# Patient Record
Sex: Male | Born: 1940 | Race: Black or African American | Hispanic: No | Marital: Single | Smoking: Current some day smoker
Health system: Southern US, Community
[De-identification: ages and names within clinical notes are randomized; demographics above are authoritative.]

## PROBLEM LIST (undated history)

## (undated) DIAGNOSIS — I10 Essential (primary) hypertension: Secondary | ICD-10-CM

## (undated) DIAGNOSIS — N400 Enlarged prostate without lower urinary tract symptoms: Secondary | ICD-10-CM

## (undated) DIAGNOSIS — E7800 Pure hypercholesterolemia, unspecified: Secondary | ICD-10-CM

## (undated) DIAGNOSIS — F4310 Post-traumatic stress disorder, unspecified: Secondary | ICD-10-CM

## (undated) DIAGNOSIS — D649 Anemia, unspecified: Secondary | ICD-10-CM

## (undated) DIAGNOSIS — H9319 Tinnitus, unspecified ear: Secondary | ICD-10-CM

## (undated) DIAGNOSIS — K219 Gastro-esophageal reflux disease without esophagitis: Secondary | ICD-10-CM

## (undated) DIAGNOSIS — G4733 Obstructive sleep apnea (adult) (pediatric): Secondary | ICD-10-CM

## (undated) HISTORY — PX: CYST EXCISION: SHX5701

## (undated) HISTORY — PX: COLONOSCOPY: SHX174

---

## 2010-03-08 ENCOUNTER — Ambulatory Visit: Payer: Self-pay | Admitting: Family Medicine

## 2010-03-27 ENCOUNTER — Ambulatory Visit: Payer: Self-pay | Admitting: Family Medicine

## 2010-04-20 ENCOUNTER — Ambulatory Visit: Payer: Self-pay | Admitting: Family Medicine

## 2012-11-19 ENCOUNTER — Observation Stay: Payer: Self-pay | Admitting: Specialist

## 2012-11-19 LAB — BASIC METABOLIC PANEL
Anion Gap: 5 — ABNORMAL LOW (ref 7–16)
BUN: 15 mg/dL (ref 7–18)
Chloride: 106 mmol/L (ref 98–107)
Co2: 28 mmol/L (ref 21–32)
EGFR (Non-African Amer.): 59 — ABNORMAL LOW
Osmolality: 281 (ref 275–301)
Sodium: 139 mmol/L (ref 136–145)

## 2012-11-19 LAB — CK TOTAL AND CKMB (NOT AT ARMC): CK-MB: 0.9 ng/mL (ref 0.5–3.6)

## 2012-11-19 LAB — LIPASE, BLOOD: Lipase: 168 U/L (ref 73–393)

## 2012-11-19 LAB — CBC
HGB: 13.5 g/dL (ref 13.0–18.0)
Platelet: 306 10*3/uL (ref 150–440)
RDW: 14.8 % — ABNORMAL HIGH (ref 11.5–14.5)
WBC: 12.1 10*3/uL — ABNORMAL HIGH (ref 3.8–10.6)

## 2012-11-19 LAB — TROPONIN I: Troponin-I: <0.02 ng/mL

## 2012-11-20 LAB — TROPONIN I: Troponin-I: <0.02 ng/mL

## 2012-11-21 LAB — CBC WITH DIFFERENTIAL/PLATELET
Basophil #: 0.1 10*3/uL (ref 0.0–0.1)
Basophil %: 0.7 %
HCT: 36.4 % — ABNORMAL LOW (ref 40.0–52.0)
Lymphocyte #: 3.3 10*3/uL (ref 1.0–3.6)
Lymphocyte %: 29.9 %
MCH: 30.8 pg (ref 26.0–34.0)
MCV: 90 fL (ref 80–100)
Monocyte %: 7.6 %
Neutrophil #: 6.6 10*3/uL — ABNORMAL HIGH (ref 1.4–6.5)
Neutrophil %: 59.2 %
RBC: 4.06 10*6/uL — ABNORMAL LOW (ref 4.40–5.90)
RDW: 14.5 % (ref 11.5–14.5)

## 2013-06-26 ENCOUNTER — Emergency Department: Payer: Self-pay | Admitting: Emergency Medicine

## 2013-06-26 LAB — COMPREHENSIVE METABOLIC PANEL
Albumin: 4.2 g/dL (ref 3.4–5.0)
Alkaline Phosphatase: 93 U/L
BUN: 15 mg/dL (ref 7–18)
Calcium, Total: 9 mg/dL (ref 8.5–10.1)
Co2: 28 mmol/L (ref 21–32)
Creatinine: 1.24 mg/dL (ref 0.60–1.30)
EGFR (African American): >60
Glucose: 143 mg/dL — ABNORMAL HIGH (ref 65–99)
Osmolality: 281 (ref 275–301)
Potassium: 3.3 mmol/L — ABNORMAL LOW (ref 3.5–5.1)
SGOT(AST): 14 U/L — ABNORMAL LOW (ref 15–37)

## 2013-06-26 LAB — CBC
HGB: 13.4 g/dL (ref 13.0–18.0)
MCH: 30.3 pg (ref 26.0–34.0)
MCHC: 33.2 g/dL (ref 32.0–36.0)
MCV: 91 fL (ref 80–100)
Platelet: 269 10*3/uL (ref 150–440)
RBC: 4.42 10*6/uL (ref 4.40–5.90)
WBC: 11.8 10*3/uL — ABNORMAL HIGH (ref 3.8–10.6)

## 2014-11-10 NOTE — Consult Note (Signed)
PATIENT NAME:  Jose Becker, VALLEY MR#:  [id number] DATE OF BIRTH:  [birth year]  DATE OF CONSULTATION:  11/20/2012  REFERRING PHYSICIAN:  Sainani CONSULTING PHYSICIAN:  Isaias Cowman, MD  PRIMARY CARE PHYSICIAN:  Linthavong  CHIEF COMPLAINT: Chest pain.   REASON FOR CONSULTATION:  Requested for evaluation of chest pain.   HISTORY OF PRESENT ILLNESS: The patient is a 74 year old gentleman without prior cardiac history. He has been in his usual state of health until 2 days prior to admission when he experienced right-sided chest discomfort. The patient thought this might be either musculoskeletal or GI in nature and took Gas-X without relief.  On the morning of admission, the patient experienced chest discomfort which woke him up.  The patient felt short of breath and diaphoretic and presented to [facility] Emergency Room. EKG was nondiagnostic. The patient has ruled out for myocardial infarction by CPK, isoenzymes and troponin.   PAST MEDICAL HISTORY: 1.  Hypertension.  2.  Hyperlipidemia.  3.  Borderline diabetes.   MEDICATIONS ON ADMISSION:  Lisinopril 40 mg daily, Nifedical 60 mg daily, alfuzosin 10 mg daily, aspirin 81 mg daily, Flomax 0.4 mg daily, Flonase nasal spray 2 sprays each nostril daily, meclizine 12.5 mg t.i.d., Voltaren Gel t.i.d., Tylenol Extra Strength 2 tabs q. 8 p.r.n.   SOCIAL HISTORY: The patient is married, lives with his wife. He has remote tobacco abuse history.   FAMILY HISTORY: No immediate family history of coronary disease or myocardial infarction.  REVIEW OF SYSTEMS:  CONSTITUTIONAL: No fever or chills.   EYES:  No blurry vision.  EARS:  No hearing loss.  RESPIRATORY:  No shortness of breath.  CARDIOVASCULAR:  Chest pain as described above.  GASTROINTESTINAL:  No nausea, vomiting, diarrhea or constipation.  GENITOURINARY:  No dysuria or hematuria.  ENDOCRINE:  No polyuria or polydipsia.  MUSCULOSKELETAL:  No arthralgias or myalgias.  NEUROLOGICAL:  No  focal muscle weakness or numbness.  PSYCHOLOGICAL:  No depression or anxiety.   PHYSICAL EXAMINATION: VITAL SIGNS: Blood pressure 161/82, pulse 76, respirations 20, temperature 98.1, pulse ox 95%.  HEENT:  Pupils equal, reactive to light and accommodation.  NECK:  Supple without thyromegaly.  LUNGS:  Clear.  CARDIOVASCULAR: Normal JVP. Normal PMI. Regular rate and rhythm. Normal S1, S2. No appreciable gallop, murmur or rub.  ABDOMEN:  Soft and nontender. Pulses were intact bilaterally.  MUSCULOSKELETAL:  Normal muscle tone.  NEUROLOGIC:  The patient is alert and oriented x 3. Motor and sensory both grossly intact.   IMPRESSION: A 74 year old gentleman with multiple cardiovascular risk factors with 2-day history of chest pain with typical and atypical features, has ruled out for myocardial infarction by CPK, isoenzymes and troponin.   RECOMMENDATIONS: 1.  Agree with overall current therapy.  2. Agree with proceeding with Lexiscan sestamibi study to rule out myocardial ischemia.  Further recommendations pending Lexiscan sestamibi results.    ____________________________ Isaias Cowman, MD ap:ce D: 11/20/2012 09:34:42 ET T: 11/20/2012 11:25:12 ET JOB#: [id number]  cc: Isaias Cowman, MD, <Dictator> Isaias Cowman MD ELECTRONICALLY SIGNED 11/29/2012 13:46

## 2014-11-10 NOTE — Discharge Summary (Signed)
PATIENT NAME:  Jose Becker, Jose Becker MR#:  [id number] DATE OF BIRTH:  03/22/1941  DATE OF ADMISSION:  11/19/2012 DATE OF DISCHARGE:  11/21/2012  DISCHARGE DIAGNOSES: 1.  Noncardiac chest pain.  2.  Hypertension.  3.  Diet-controlled diabetes.  4.  Benign prostatic hypertrophy.  5.  Hyperlipidemia.   DISCHARGE MEDICATIONS: 1.  Alfuzosin 10 mg extended-release 1 tab p.o. daily.  2.  Simvastatin 40 mg p.o. at bedtime.  3.  Aspirin 81 mg p.o. daily.  4.  Tylenol 500 mg 2 tabs p.o. q.8 hours scheduled.  5.  Voltaren topical 1% topical gel applied to affected area 3 times a day as needed.  6.  Lisinopril 40 mg p.o. daily.  7.  Fluticasone nasal 50 mcg 2 sprays each nostril daily.  8.  Meclizine 12.5 mg t.i.d. p.r.n. for dizziness.  9.  Flomax 0.4 mg p.o. daily.  10.  Nifedipine 90 mg extended-release 1 tab p.o. daily, this was increased from 60 mg.   CONSULTS: Cardiology per Dr. Saralyn Pilar.   PROCEDURES: Had a nuclear stress test that was negative.   LABS PRIOR TO DISCHARGE: Cardiac enzymes negative x 3. White blood cell count 12.1, hemoglobin 13.5, platelets of 306. Chest x-ray did show mild interstitial edema. Repeat white blood cell count pending at this time.   BRIEF HOSPITAL COURSE:  1.  Noncardiac chest pain. The patient initially came in complaining of right-sided chest pain that radiated across the chest. EKG was negative. Cardiac enzymes were negative x 3. Cardiology was consulted. A nuclear stress test was performed which was also negative for any type of ischemia. He was cleared for discharge. This is most likely musculoskeletal versus GI-related. Follow up as an outpatient. We will continue his home regimen to control his risk factors.  2.  Hypertension. His blood pressure was acutely elevated. His nifedipine was increased from 60 to 90 during his hospital stay. Will need to monitor this as an outpatient.  3.  Other chronic medical issues remained stable. No changes to those  regimens.   DISPOSITION: He is in stable condition to be discharged to home.   FOLLOWUP: Follow up with Dr. Netty Starring in 10 days.   ____________________________ Dion Body, MD kl:cs D: 11/21/2012 08:41:41 ET T: 11/21/2012 14:55:04 ET JOB#: [id number]  cc: Dion Body, MD, <Dictator> Dion Body MD ELECTRONICALLY SIGNED 11/24/2012 16:50

## 2014-11-10 NOTE — H&P (Signed)
PATIENT NAME:  ZAYYAN, MULLEN MR#:  [id number] DATE OF BIRTH:  1941-01-12  DATE OF ADMISSION:  11/19/2012.  PRIMARY CARE PHYSICIAN:  Dr. Netty Starring.   CHIEF COMPLAINT:  Chest pain.   HISTORY OF PRESENT ILLNESS:  This is a 74 year old male who presents to the Emergency Room complaining of chest pain ongoing for the past 2 days. The patient first describes his pain as sharp in nature, located in the right side of his chest, ongoing for the past week. The patient thought it was probably related to some gas or musculoskeletal in nature. He tried taking some Gas-X, although it did not improve his symptoms. This morning when he woke up, his pain had now migrated to the center of his chest and it felt heavy in nature. He also became a bit diaphoretic and short of breath with it. He was concerned and therefore came to the ER for further evaluation. The patient still continues to have some mild chest heaviness. He denies any upper respiratory symptoms. He does have a history of allergic rhinitis but no present acute cough or congestion. He denies any dizziness, light lightheadedness. He denies any nausea, vomiting, abdominal pain. No other associated symptoms presently. Given his history of borderline diabetes, hypertension, hyperlipidemia and high risk for having some coronary artery disease, the Hospitalist Services were contacted for further treatment and evaluation.   REVIEW OF SYSTEMS:  CONSTITUTIONAL:  No documented fever. No weight gain or weight loss.  EYES:  No blurred or double vision.  ENT:  No tinnitus. No postnasal drip. No redness of the oropharynx.  RESPIRATORY:  No cough, no wheeze, no hemoptysis, no dyspnea.  CARDIOVASCULAR:  Positive chest pain. No orthopnea, no palpitations, no syncope.  GASTROINTESTINAL:  No nausea. No vomiting, no diarrhea, no abdominal pain, no melena or hematochezia.  GENITOURINARY:  No dysuria or hematuria.  ENDOCRINE:  No polyuria or nocturia. No heat or cold  intolerance.  HEMATOLOGIC:  No anemia, bruising, bleeding.  INTEGUMENTARY:  No rashes. No lesions.  MUSCULOSKELETAL:  No arthritis. No swelling. No gout.  NEUROLOGIC:  No numbness, no tingling, no ataxia. No seizure-type activity.  PSYCHIATRIC:  No anxiety. No insomnia. No ADD.   PAST MEDICAL HISTORY:  Consistent with hypertension, hyperlipidemia, a history of osteoarthritis, borderline diabetes, obesity, a history of BPH.   ALLERGIES:  No known drug allergies.   SOCIAL HISTORY:  Used to be a smoker. Quit 30+ years ago. Occasional alcohol use. No illicit drug abuse. Lives at home with his wife.   FAMILY HISTORY:  His mother had diabetes> she died from some respiratory illness. Father died from complications of colon cancer.   CURRENT MEDICATIONS are as follows:  Alfuzosin which is 10 mg daily, aspirin 81 mg daily, Flomax 0.4 mg daily, Flonase 2 sprays to each nostril daily, lisinopril 40 mg daily, meclizine 12.5 mg t.i.d. as needed, Nifedical 60 mg daily, simvastatin 40 mg daily, Tylenol Extra Strength 500 mg 2 tabs q.8 hours as needed, on Voltaren gel t.i.d. as needed.   PHYSICAL EXAMINATION ON ADMISSION IS AS FOLLOWS:  VITAL SIGNS:  Temperature 98.5, pulse 70, respirations 16, blood pressure 135/79, sats 96% on room air.  GENERAL:  He is a pleasant appearing male in no apparent distress.  HEENT:  Atraumatic, normocephalic. Extraocular muscles are intact. Pupils equal, reactive to light. Sclerae anicteric. No conjunctival injection. No pharyngeal erythema.  NECK:  Supple. There is no jugular venous distention, no bruits, no lymphadenopathy, no thyromegaly.  HEART:  Regular rate  and rhythm. No murmurs or rubs. No clicks.  LUNGS:  Clear to auscultation bilaterally. No rales or rhonchi. No wheezes.  ABDOMEN:  Soft, flat, nontender, nondistended. Has good bowel sounds. No hepatosplenomegaly appreciated.  EXTREMITIES:  No evidence of any cyanosis, clubbing, or peripheral edema. Has +2 pedal and  radial pulses bilaterally.  NEUROLOGICAL:  The patient is alert, awake, and oriented x 3 with no focal motor or sensory deficits appreciated bilaterally.  SKIN:  Moist and warm with no rashes appreciated.  LYMPHATIC:  There is no cervical or axillary lymphadenopathy.   LABORATORY, DIAGNOSTIC, AND RADIOLOGICAL DATA:  Serum glucose 151, BUN 15, creatinine 1.2, sodium 139, potassium 3.5, chloride 106, bicarb 28. The patient's troponin is less than 0.02. White cell count 12.1, hemoglobin 13.5, hematocrit 39.9, platelet count 306. The patient did have a chest x-ray done which showed no evidence of any acute cardiopulmonary disease. The patient also had an EKG done, which shows normal sinus rhythm with normal axis and no evidence of any acute ST or T wave changes.   ASSESSMENT AND PLAN:  This is a 74 year old male with a history of hypertension, hyperlipidemia, a history of osteoarthritis, borderline diabetes, obesity, a history of benign prostatic hypertrophy who presents to the hospital with chest pain.   1.  Chest pain:  The patient does have some typical symptoms for angina, also has significant risk factors given obesity, borderline diabetes and hyperlipidemia. Therefore, I will observe him overnight on telemetry, follow with serial cardiac markers, the first set is negative. I will get a Cardiology consult. I discussed the case with Dr. Saralyn Pilar. We will get a stress test in the morning. Continue aspirin, statin, nitroglycerin, morphine and oxygen for now.  2.  Hypertension:  The patient presently hemodynamically stable. Continue Nifedical, continue lisinopril. 3.  Hyperlipidemia:  Continue simvastatin.  4.  Benign prostatic hypertrophy:  Continue Flomax 0.05. 5.  Allergic rhinitis:  Continue Flonase.   CODE STATUS:  The patient is a FULL CODE.   The patient will be transferred over to Dr. Raylene Miyamoto service.   TIME SPENT:  50 minutes.   ____________________________ Belia Heman. Verdell Carmine,  MD [id number] D: 11/19/2012 10:44:34 ET T: 11/19/2012 11:17:35 ET JOB#: [id number]  cc: Belia Heman. Verdell Carmine, MD, <Dictator> Henreitta Leber MD ELECTRONICALLY SIGNED 11/23/2012 12:52

## 2018-03-28 ENCOUNTER — Emergency Department: Payer: Non-veteran care

## 2018-03-28 ENCOUNTER — Encounter: Payer: Self-pay | Admitting: Emergency Medicine

## 2018-03-28 ENCOUNTER — Other Ambulatory Visit: Payer: Self-pay

## 2018-03-28 ENCOUNTER — Emergency Department
Admission: EM | Admit: 2018-03-28 | Discharge: 2018-03-28 | Disposition: A | Discharge status: Home / Self Care | Payer: Non-veteran care | Attending: Emergency Medicine | Admitting: Emergency Medicine

## 2018-03-28 DIAGNOSIS — R42 Dizziness and giddiness: Secondary | ICD-10-CM | POA: Diagnosis not present

## 2018-03-28 DIAGNOSIS — I10 Essential (primary) hypertension: Secondary | ICD-10-CM | POA: Diagnosis not present

## 2018-03-28 DIAGNOSIS — F17210 Nicotine dependence, cigarettes, uncomplicated: Secondary | ICD-10-CM | POA: Insufficient documentation

## 2018-03-28 DIAGNOSIS — Z79899 Other long term (current) drug therapy: Secondary | ICD-10-CM | POA: Insufficient documentation

## 2018-03-28 DIAGNOSIS — M79602 Pain in left arm: Secondary | ICD-10-CM | POA: Diagnosis present

## 2018-03-28 DIAGNOSIS — M542 Cervicalgia: Secondary | ICD-10-CM | POA: Diagnosis not present

## 2018-03-28 DIAGNOSIS — R079 Chest pain, unspecified: Secondary | ICD-10-CM

## 2018-03-28 HISTORY — DX: Anemia, unspecified: D64.9

## 2018-03-28 HISTORY — DX: Post-traumatic stress disorder, unspecified: F43.10

## 2018-03-28 HISTORY — DX: Pure hypercholesterolemia, unspecified: E78.00

## 2018-03-28 HISTORY — DX: Gilbert syndrome: E80.4

## 2018-03-28 HISTORY — DX: Gastro-esophageal reflux disease without esophagitis: K21.9

## 2018-03-28 HISTORY — DX: Benign prostatic hyperplasia without lower urinary tract symptoms: N40.0

## 2018-03-28 HISTORY — DX: Tinnitus, unspecified ear: H93.19

## 2018-03-28 HISTORY — DX: Essential (primary) hypertension: I10

## 2018-03-28 HISTORY — DX: Obstructive sleep apnea (adult) (pediatric): G47.33

## 2018-03-28 LAB — BASIC METABOLIC PANEL
ANION GAP: 8 (ref 5–15)
BUN: 17 mg/dL (ref 8–23)
CALCIUM: 9.4 mg/dL (ref 8.9–10.3)
CO2: 27 mmol/L (ref 22–32)
Chloride: 105 mmol/L (ref 98–111)
Creatinine, Ser: 1.25 mg/dL — ABNORMAL HIGH (ref 0.61–1.24)
GFR calc Af Amer: >60 mL/min (ref >60)
GFR, EST NON AFRICAN AMERICAN: 53 mL/min — AB (ref >60)
GLUCOSE: 120 mg/dL — AB (ref 70–99)
Potassium: 3.4 mmol/L — ABNORMAL LOW (ref 3.5–5.1)
Sodium: 140 mmol/L (ref 135–145)

## 2018-03-28 LAB — CBC
HCT: 38.7 % — ABNORMAL LOW (ref 40.0–52.0)
HEMOGLOBIN: 13.3 g/dL (ref 13.0–18.0)
MCH: 32.3 pg (ref 26.0–34.0)
MCHC: 34.5 g/dL (ref 32.0–36.0)
MCV: 93.7 fL (ref 80.0–100.0)
Platelets: 334 10*3/uL (ref 150–440)
RBC: 4.13 MIL/uL — ABNORMAL LOW (ref 4.40–5.90)
RDW: 15.1 % — AB (ref 11.5–14.5)
WBC: 10.9 10*3/uL — ABNORMAL HIGH (ref 3.8–10.6)

## 2018-03-28 LAB — TROPONIN I
Troponin I: <0.03 ng/mL (ref <0.03)
Troponin I: <0.03 ng/mL (ref <0.03)
Troponin I: <0.03 ng/mL (ref <0.03)

## 2018-03-28 MED ORDER — IOHEXOL 350 MG/ML SOLN
75.0000 mL | Freq: Once | INTRAVENOUS | Status: AC | PRN
  Administered 2018-03-28: 75 mL via INTRAVENOUS

## 2018-03-28 NOTE — ED Provider Notes (Signed)
[facility] Emergency Department Provider Note    First MD Initiated Contact with Patient 03/28/18 0112     (approximate)  I have reviewed the triage vital signs and the nursing notes.   HISTORY  Chief Complaint Arm Pain    HPI Jose Becker is a 77 y.o. male presents to the emergency department with left neck arm upper chest shooting pain x1 hour.  Patient states that the pain has been occurring intermittently now for approximately 1 week and he was evaluated at the [facility] for the same.  Patient states the pain began tonight while watching television.  Patient denies any dyspnea at present but admits to dyspnea earlier.  Patient denies any weakness numbness visual changes or headache at present.  Patient states that he was scheduled to have a MRI of his head and neck performed   Past Medical History:  Diagnosis Date  . Anemia   . BPH (benign prostatic hyperplasia)   . GERD (gastroesophageal reflux disease)   . Gilbert's syndrome   . High cholesterol   . Hypertension   . Obstructive sleep apnea   . Post traumatic stress disorder   . Tinnitus     There are no active problems to display for this patient.   Past Surgical History:  Procedure Laterality Date  . CYST EXCISION     right arm and back    Prior to Admission medications   Medication Sig Start Date End Date Taking? Authorizing Provider  alfuzosin (UROXATRAL) 10 MG 24 hr tablet Take 10 mg by mouth daily.   Yes [provider]  aspirin EC 81 MG tablet Take 81 mg by mouth daily.   Yes [provider]  esomeprazole (NEXIUM) 40 MG capsule Take 40 mg by mouth 2 (two) times daily. 10/15/15  Yes [provider]  fluticasone (FLONASE) 50 MCG/ACT nasal spray Place 2 sprays into the nose daily. 09/18/17  Yes [provider]  fluticasone (FLOVENT DISKUS) 50 MCG/BLIST diskus inhaler Inhale 2 puffs into the lungs daily.   Yes [provider]  GNP D 1000 1000  units capsule Take 1,000 Units by mouth daily. 01/23/18  Yes [provider]  lisinopril (PRINIVIL,ZESTRIL) 40 MG tablet Take 40 mg by mouth daily. 01/09/18  Yes [provider]  meclizine (ANTIVERT) 12.5 MG tablet Take 12.5 mg by mouth 3 (three) times daily as needed for dizziness. 02/24/18  Yes [provider]  NIFEdipine (PROCARDIA XL/ADALAT-CC) 60 MG 24 hr tablet Take 60 mg by mouth daily. 01/30/18  Yes [provider]  simvastatin (ZOCOR) 20 MG tablet Take 20 mg by mouth at bedtime. 07/23/16  Yes [provider]  tamsulosin (FLOMAX) 0.4 MG CAPS capsule Take 0.4 mg by mouth at bedtime.   Yes [provider]  vitamin B-12 (CYANOCOBALAMIN) 1000 MCG tablet Take 1,000 mcg by mouth daily.   Yes [provider]    Allergies No known drug allergies History reviewed. No pertinent family history.  Social History Social History   Tobacco Use  . Smoking status: Current Some Day Smoker    Types: Cigars  . Smokeless tobacco: Never Used  Substance Use Topics  . Alcohol use: Yes    Comment: every now and then  . Drug use: Never    Review of Systems Constitutional: No fever/chills Eyes: No visual changes. ENT: No sore throat. Cardiovascular: Denies chest pain. Respiratory: Denies shortness of breath. Gastrointestinal: No abdominal pain.  No nausea, no vomiting.  No  diarrhea.  No constipation. Genitourinary: Negative for dysuria. Musculoskeletal: Negative for neck pain.  Negative for back pain. Integumentary: Negative for rash. Neurological: Negative for headaches, focal weakness or numbness.   ____________________________________________   PHYSICAL EXAM:  VITAL SIGNS: ED Triage Vitals  Enc Vitals Group     BP 03/28/18 0010 (!) 158/82     Pulse Rate 03/28/18 0010 81     Resp 03/28/18 0010 20     Temp 03/28/18 0010 98.2 F (36.8 C)     Temp Source 03/28/18 0010 Oral     SpO2 03/28/18 0010 96 %     Weight 03/28/18 0012  110.7 kg (244 lb)     Height 03/28/18 0012 1.854 m (6\' 1" )     Head Circumference --      Peak Flow --      Pain Score 03/28/18 0019 8     Pain Loc --      Pain Edu? --      Excl. in [place]? --     Constitutional: Alert and oriented. Well appearing and in no acute distress. Eyes: Conjunctivae are normal. PERRL. EOMI. Head: Atraumatic. Mouth/Throat: Mucous membranes are moist. Oropharynx non-erythematous. Neck: No stridor.  No meningeal signs.  No cervical spine tenderness to palpation. Cardiovascular: Normal rate, regular rhythm. Good peripheral circulation. Grossly normal heart sounds. Respiratory: Normal respiratory effort.  No retractions. Lungs CTAB. Gastrointestinal: Soft and nontender. No distention.  Musculoskeletal: No lower extremity tenderness nor edema. No gross deformities of extremities. Neurologic:  Normal speech and language. No gross focal neurologic deficits are appreciated.  Skin:  Skin is warm, dry and intact. No rash noted. Psychiatric: Mood and affect are normal. Speech and behavior are normal.  ____________________________________________   LABS (all labs ordered are listed, but only abnormal results are displayed)  Labs Reviewed  BASIC METABOLIC PANEL - Abnormal; Notable for the following components:      Result Value   Potassium 3.4 (*)    Glucose, Bld 120 (*)    Creatinine, Ser 1.25 (*)    GFR calc non Af Amer 53 (*)    All other components within normal limits  CBC - Abnormal; Notable for the following components:   WBC 10.9 (*)    RBC 4.13 (*)    HCT 38.7 (*)    RDW 15.1 (*)    All other components within normal limits  TROPONIN I  TROPONIN I  TROPONIN I   ____________________________________________  EKG  ED ECG REPORT I, [place] N Rhythm Gubbels, the attending physician, personally viewed and interpreted this ECG.   Date: 03/28/2018  EKG Time: 12:08 AM  Rate: 82  Rhythm: Normal sinus rhythm  Axis: Normal  Intervals: Normal  ST&T Change:  None  ____________________________________________  RADIOLOGY I, [place] N Trayon Krantz, personally viewed and evaluated these images (plain radiographs) as part of my medical decision making, as well as reviewing the written report by the radiologist.  ED MD interpretation: No acute pathology noted on CT Angie of the head and neck  Official radiology report(s): Ct Angio Head W Or Wo Contrast  Result Date: 03/28/2018 CLINICAL DATA:  Central vertigo.  Acute onset left-sided neck pain. EXAM: CT ANGIOGRAPHY HEAD AND NECK TECHNIQUE: Multidetector CT imaging of the head and neck was performed using the standard protocol during bolus administration of intravenous contrast. Multiplanar CT image reconstructions and MIPs were obtained to evaluate the vascular anatomy. Carotid stenosis measurements (when applicable) are obtained utilizing NASCET criteria, using the distal internal carotid diameter  as the denominator. CONTRAST:  10mL OMNIPAQUE IOHEXOL 350 MG/ML SOLN COMPARISON:  Head CT 06/26/2013 FINDINGS: CT HEAD FINDINGS Brain: There is no mass, hemorrhage or extra-axial collection. The size and configuration of the ventricles and extra-axial CSF spaces are normal. There is no acute or chronic infarction. There is hypoattenuation of the periventricular white matter, most commonly indicating chronic ischemic microangiopathy. Skull: The visualized skull base, calvarium and extracranial soft tissues are normal. Sinuses/Orbits: No fluid levels or advanced mucosal thickening of the visualized paranasal sinuses. No mastoid or middle ear effusion. The orbits are normal. CTA NECK FINDINGS AORTIC ARCH: There is no calcific atherosclerosis of the aortic arch. There is no aneurysm, dissection or hemodynamically significant stenosis of the visualized ascending aorta and aortic arch. Normal variant aortic arch branching pattern with the left vertebral artery arising independently from the aortic arch. The visualized proximal  subclavian arteries are widely patent. RIGHT CAROTID SYSTEM: --Common carotid artery: Much of the right common carotid arteries obscured by streak artifact from contrast within the right external jugular vein. --Internal carotid artery: No dissection, occlusion or aneurysm. Mild atherosclerotic calcification at the carotid bifurcation without hemodynamically significant stenosis. --External carotid artery: No acute abnormality. LEFT CAROTID SYSTEM: --Common carotid artery: Widely patent origin without common carotid artery dissection or aneurysm. --Internal carotid artery:No dissection, occlusion or aneurysm. Mild atherosclerotic calcification at the carotid bifurcation without hemodynamically significant stenosis. --External carotid artery: No acute abnormality. VERTEBRAL ARTERIES: Codominant configuration. Origins are poorly visualized. No dissection, occlusion or flow-limiting stenosis to the vertebrobasilar confluence. SKELETON: There is no bony spinal canal stenosis. No lytic or blastic lesion. OTHER NECK: Thyroid goiter with marked enlargement of the right thyroid lobe. UPPER CHEST: No pneumothorax or pleural effusion. No nodules or masses. CTA HEAD FINDINGS ANTERIOR CIRCULATION: --Intracranial internal carotid arteries: Atherosclerotic calcification of the internal carotid arteries at the skull base without hemodynamically significant stenosis. --Anterior cerebral arteries: Normal. Both A1 segments are present. Patent anterior communicating artery. --Middle cerebral arteries: Normal. --Posterior communicating arteries: Present bilaterally. POSTERIOR CIRCULATION: --Basilar artery: Normal. --Posterior cerebral arteries: Normal. --Superior cerebellar arteries: Normal. --Inferior cerebellar arteries: Normal anterior and posterior inferior cerebellar arteries. VENOUS SINUSES: As permitted by contrast timing, patent. ANATOMIC VARIANTS: None DELAYED PHASE: No parenchymal contrast enhancement. Review of the MIP  images confirms the above findings. IMPRESSION: 1. No acute intracranial abnormality. 2. No emergent large vessel occlusion or hemodynamically significant stenosis of the head or neck. 3. Chronic ischemic microangiopathy of the brain. 4. Thyroid goiter. Electronically Signed   By: Ulyses Jarred M.D.   On: 03/28/2018 02:37   Dg Chest 2 View  Result Date: 03/28/2018 CLINICAL DATA:  Left-sided neck pain radiating down the left arm starting about our ago. Shortness of breath. EXAM: CHEST - 2 VIEW COMPARISON:  11/19/2012 FINDINGS: Normal heart size and pulmonary vascularity. No focal airspace disease or consolidation in the lungs. No blunting of costophrenic angles. No pneumothorax. Mediastinal contours appear intact. Degenerative changes in the spine. Tortuous aorta. IMPRESSION: No active cardiopulmonary disease. Electronically Signed   By: Lucienne Capers M.D.   On: 03/28/2018 00:49   Ct Angio Neck W And/or Wo Contrast  Result Date: 03/28/2018 CLINICAL DATA:  Central vertigo.  Acute onset left-sided neck pain. EXAM: CT ANGIOGRAPHY HEAD AND NECK TECHNIQUE: Multidetector CT imaging of the head and neck was performed using the standard protocol during bolus administration of intravenous contrast. Multiplanar CT image reconstructions and MIPs were obtained to evaluate the vascular anatomy. Carotid stenosis measurements (when applicable) are obtained  utilizing NASCET criteria, using the distal internal carotid diameter as the denominator. CONTRAST:  63mL OMNIPAQUE IOHEXOL 350 MG/ML SOLN COMPARISON:  Head CT 06/26/2013 FINDINGS: CT HEAD FINDINGS Brain: There is no mass, hemorrhage or extra-axial collection. The size and configuration of the ventricles and extra-axial CSF spaces are normal. There is no acute or chronic infarction. There is hypoattenuation of the periventricular white matter, most commonly indicating chronic ischemic microangiopathy. Skull: The visualized skull base, calvarium and extracranial soft  tissues are normal. Sinuses/Orbits: No fluid levels or advanced mucosal thickening of the visualized paranasal sinuses. No mastoid or middle ear effusion. The orbits are normal. CTA NECK FINDINGS AORTIC ARCH: There is no calcific atherosclerosis of the aortic arch. There is no aneurysm, dissection or hemodynamically significant stenosis of the visualized ascending aorta and aortic arch. Normal variant aortic arch branching pattern with the left vertebral artery arising independently from the aortic arch. The visualized proximal subclavian arteries are widely patent. RIGHT CAROTID SYSTEM: --Common carotid artery: Much of the right common carotid arteries obscured by streak artifact from contrast within the right external jugular vein. --Internal carotid artery: No dissection, occlusion or aneurysm. Mild atherosclerotic calcification at the carotid bifurcation without hemodynamically significant stenosis. --External carotid artery: No acute abnormality. LEFT CAROTID SYSTEM: --Common carotid artery: Widely patent origin without common carotid artery dissection or aneurysm. --Internal carotid artery:No dissection, occlusion or aneurysm. Mild atherosclerotic calcification at the carotid bifurcation without hemodynamically significant stenosis. --External carotid artery: No acute abnormality. VERTEBRAL ARTERIES: Codominant configuration. Origins are poorly visualized. No dissection, occlusion or flow-limiting stenosis to the vertebrobasilar confluence. SKELETON: There is no bony spinal canal stenosis. No lytic or blastic lesion. OTHER NECK: Thyroid goiter with marked enlargement of the right thyroid lobe. UPPER CHEST: No pneumothorax or pleural effusion. No nodules or masses. CTA HEAD FINDINGS ANTERIOR CIRCULATION: --Intracranial internal carotid arteries: Atherosclerotic calcification of the internal carotid arteries at the skull base without hemodynamically significant stenosis. --Anterior cerebral arteries: Normal.  Both A1 segments are present. Patent anterior communicating artery. --Middle cerebral arteries: Normal. --Posterior communicating arteries: Present bilaterally. POSTERIOR CIRCULATION: --Basilar artery: Normal. --Posterior cerebral arteries: Normal. --Superior cerebellar arteries: Normal. --Inferior cerebellar arteries: Normal anterior and posterior inferior cerebellar arteries. VENOUS SINUSES: As permitted by contrast timing, patent. ANATOMIC VARIANTS: None DELAYED PHASE: No parenchymal contrast enhancement. Review of the MIP images confirms the above findings. IMPRESSION: 1. No acute intracranial abnormality. 2. No emergent large vessel occlusion or hemodynamically significant stenosis of the head or neck. 3. Chronic ischemic microangiopathy of the brain. 4. Thyroid goiter. Electronically Signed   By: Ulyses Jarred M.D.   On: 03/28/2018 02:37     Procedures   ____________________________________________   INITIAL IMPRESSION / ASSESSMENT AND PLAN / ED COURSE  As part of my medical decision making, I reviewed the following data within the electronic MEDICAL RECORD NUMBER  77 year old male presenting with above-stated history and physical exam presenting multiple clinical possibilities including ACS, angina.  Patient's EKG revealed no evidence of ischemia or infarction troponin x3 was performed which were all negative.  However possibility of angina still a possibility.  Patient has no pain or any other complaints at present.  Also considered possibility of posterior cerebral circulation pathology and as such CT angios the neck head were performed which revealed no acute pathology.  Spoke with patient at length regarding the necessity of following up with Dr. call with cardiologist for further outpatient evaluation. ____________________________________________  FINAL CLINICAL IMPRESSION(S) / ED DIAGNOSES  Final diagnoses:  Chest pain,  unspecified type     MEDICATIONS GIVEN DURING THIS  VISIT:  Medications  iohexol (OMNIPAQUE) 350 MG/ML injection 75 mL (75 mLs Intravenous Contrast Given 03/28/18 0211)     ED Discharge Orders    None       Note:  This document was prepared using Dragon voice recognition software and may include unintentional dictation errors.    Gregor Hams, MD 03/28/18 2221

## 2018-03-28 NOTE — ED Notes (Signed)
Patient transported to CT 

## 2018-03-28 NOTE — ED Triage Notes (Addendum)
Pt reports left sided neck pain shooting and radiating down his left arm that started about 1 hour ago; wife says they were in bed watching tv when pain started; reports shortness of breath; denies weakness; denies N/V;

## 2019-03-14 ENCOUNTER — Other Ambulatory Visit: Payer: Self-pay

## 2019-03-14 DIAGNOSIS — Z20822 Contact with and (suspected) exposure to covid-19: Secondary | ICD-10-CM

## 2019-03-16 LAB — NOVEL CORONAVIRUS, NAA: SARS-CoV-2, NAA: NOT DETECTED

## 2019-03-17 ENCOUNTER — Telehealth: Payer: Self-pay | Admitting: General Practice

## 2019-03-17 NOTE — Telephone Encounter (Signed)
Negative COVID results given. Patient results "NOT Detected." Caller expressed understanding. ° °

## 2019-08-03 ENCOUNTER — Emergency Department: Payer: Non-veteran care

## 2019-08-03 ENCOUNTER — Encounter: Payer: Self-pay | Admitting: Emergency Medicine

## 2019-08-03 ENCOUNTER — Other Ambulatory Visit: Payer: Self-pay

## 2019-08-03 ENCOUNTER — Emergency Department
Admission: EM | Admit: 2019-08-03 | Discharge: 2019-08-03 | Discharge status: Against Medical Advice | Payer: Non-veteran care | Attending: Emergency Medicine | Admitting: Emergency Medicine

## 2019-08-03 DIAGNOSIS — Z5321 Procedure and treatment not carried out due to patient leaving prior to being seen by health care provider: Secondary | ICD-10-CM | POA: Diagnosis not present

## 2019-08-03 DIAGNOSIS — R0789 Other chest pain: Secondary | ICD-10-CM | POA: Insufficient documentation

## 2019-08-03 LAB — TROPONIN I (HIGH SENSITIVITY)
Troponin I (High Sensitivity): 8 ng/L (ref <18)
Troponin I (High Sensitivity): 9 ng/L (ref <18)

## 2019-08-03 LAB — BASIC METABOLIC PANEL
Anion gap: 8 (ref 5–15)
BUN: 20 mg/dL (ref 8–23)
CO2: 29 mmol/L (ref 22–32)
Calcium: 9.5 mg/dL (ref 8.9–10.3)
Chloride: 101 mmol/L (ref 98–111)
Creatinine, Ser: 1.49 mg/dL — ABNORMAL HIGH (ref 0.61–1.24)
GFR calc Af Amer: 51 mL/min — ABNORMAL LOW (ref >60)
GFR calc non Af Amer: 44 mL/min — ABNORMAL LOW (ref >60)
Glucose, Bld: 159 mg/dL — ABNORMAL HIGH (ref 70–99)
Potassium: 3.4 mmol/L — ABNORMAL LOW (ref 3.5–5.1)
Sodium: 138 mmol/L (ref 135–145)

## 2019-08-03 LAB — CBC
HCT: 37 % — ABNORMAL LOW (ref 39.0–52.0)
Hemoglobin: 12.5 g/dL — ABNORMAL LOW (ref 13.0–17.0)
MCH: 31.8 pg (ref 26.0–34.0)
MCHC: 33.8 g/dL (ref 30.0–36.0)
MCV: 94.1 fL (ref 80.0–100.0)
Platelets: 259 10*3/uL (ref 150–400)
RBC: 3.93 MIL/uL — ABNORMAL LOW (ref 4.22–5.81)
RDW: 13 % (ref 11.5–15.5)
WBC: 7.9 10*3/uL (ref 4.0–10.5)
nRBC: 0 % (ref 0.0–0.2)

## 2019-08-03 NOTE — ED Notes (Signed)
Pt reports is leaving and will see his doctor tomorrow at his appointment at the [place].

## 2019-08-03 NOTE — ED Triage Notes (Signed)
Says pains in left arm on and off about a week.  Today he was walking [place] and he felt pain across chest as well as arm and got into a sweat.

## 2019-08-05 ENCOUNTER — Telehealth: Payer: Self-pay | Admitting: Emergency Medicine

## 2019-08-05 NOTE — Telephone Encounter (Signed)
Called patient due to lwot to inquire about condition and follow up plans. Spoke with wife.  She says he went to [place] today.  I explained that the chest xray we did had recommendation from radiologist for a ct scan.  She says she thinks he is having a ct scan today, but she is not sure.  I gave her my number and she will have him call me if he gets there soon.  She says he also seen dr Netty Starring so will send it to her too

## 2019-12-25 ENCOUNTER — Emergency Department: Payer: No Typology Code available for payment source

## 2019-12-25 ENCOUNTER — Emergency Department
Admission: EM | Admit: 2019-12-25 | Discharge: 2019-12-25 | Disposition: A | Discharge status: Home / Self Care | Payer: No Typology Code available for payment source | Attending: Emergency Medicine | Admitting: Emergency Medicine

## 2019-12-25 ENCOUNTER — Other Ambulatory Visit: Payer: Self-pay

## 2019-12-25 DIAGNOSIS — Z7982 Long term (current) use of aspirin: Secondary | ICD-10-CM | POA: Insufficient documentation

## 2019-12-25 DIAGNOSIS — M79622 Pain in left upper arm: Secondary | ICD-10-CM | POA: Diagnosis present

## 2019-12-25 DIAGNOSIS — E079 Disorder of thyroid, unspecified: Secondary | ICD-10-CM

## 2019-12-25 DIAGNOSIS — F17290 Nicotine dependence, other tobacco product, uncomplicated: Secondary | ICD-10-CM | POA: Insufficient documentation

## 2019-12-25 DIAGNOSIS — Z79899 Other long term (current) drug therapy: Secondary | ICD-10-CM | POA: Insufficient documentation

## 2019-12-25 DIAGNOSIS — M542 Cervicalgia: Secondary | ICD-10-CM | POA: Diagnosis not present

## 2019-12-25 DIAGNOSIS — R519 Headache, unspecified: Secondary | ICD-10-CM | POA: Insufficient documentation

## 2019-12-25 DIAGNOSIS — I10 Essential (primary) hypertension: Secondary | ICD-10-CM | POA: Diagnosis not present

## 2019-12-25 LAB — COMPREHENSIVE METABOLIC PANEL
ALT: 18 U/L (ref 0–44)
AST: 26 U/L (ref 15–41)
Albumin: 4.3 g/dL (ref 3.5–5.0)
Alkaline Phosphatase: 47 U/L (ref 38–126)
Anion gap: 12 (ref 5–15)
BUN: 22 mg/dL (ref 8–23)
CO2: 27 mmol/L (ref 22–32)
Calcium: 9.6 mg/dL (ref 8.9–10.3)
Chloride: 99 mmol/L (ref 98–111)
Creatinine, Ser: 1.67 mg/dL — ABNORMAL HIGH (ref 0.61–1.24)
GFR calc Af Amer: 44 mL/min — ABNORMAL LOW (ref >60)
GFR calc non Af Amer: 38 mL/min — ABNORMAL LOW (ref >60)
Glucose, Bld: 138 mg/dL — ABNORMAL HIGH (ref 70–99)
Potassium: 3.1 mmol/L — ABNORMAL LOW (ref 3.5–5.1)
Sodium: 138 mmol/L (ref 135–145)
Total Bilirubin: 3.1 mg/dL — ABNORMAL HIGH (ref 0.3–1.2)
Total Protein: 7.3 g/dL (ref 6.5–8.1)

## 2019-12-25 LAB — CBC
HCT: 36 % — ABNORMAL LOW (ref 39.0–52.0)
Hemoglobin: 12.6 g/dL — ABNORMAL LOW (ref 13.0–17.0)
MCH: 32.8 pg (ref 26.0–34.0)
MCHC: 35 g/dL (ref 30.0–36.0)
MCV: 93.8 fL (ref 80.0–100.0)
Platelets: 294 10*3/uL (ref 150–400)
RBC: 3.84 MIL/uL — ABNORMAL LOW (ref 4.22–5.81)
RDW: 12.8 % (ref 11.5–15.5)
WBC: 9.8 10*3/uL (ref 4.0–10.5)
nRBC: 0 % (ref 0.0–0.2)

## 2019-12-25 LAB — TROPONIN I (HIGH SENSITIVITY)
Troponin I (High Sensitivity): 11 ng/L (ref <18)
Troponin I (High Sensitivity): 9 ng/L (ref <18)

## 2019-12-25 MED ORDER — POTASSIUM CHLORIDE CRYS ER 20 MEQ PO TBCR
40.0000 meq | EXTENDED_RELEASE_TABLET | Freq: Once | ORAL | Status: AC
  Administered 2019-12-25: 40 meq via ORAL
  Filled 2019-12-25: qty 2

## 2019-12-25 NOTE — ED Provider Notes (Signed)
[facility] Emergency Department Provider Note  ____________________________________________  Time seen: Approximately 12:34 PM  I have reviewed the triage vital signs and the nursing notes.   HISTORY  Chief Complaint Arm Pain    HPI Jose Becker is a 79 y.o. male that presents to the emergency department for evaluation of left upper arm discomfort for 2 days.  Patient states that he started out with a boil to the back of his head.  The boil burst and started to drain.  Boil is no longer tender.  Pain spread to the back his neck.  He describes the back of his neck as feeling stiff.  Patient now moved into his left upper arm.  He describes pain as throbbing and moving.  Pain does occasionally go into his forearm.  Pain is elicited when he moves his left shoulder.  No shortness of breath.  No difficulty swallowing.  No fevers.  No numbness, tingling.   Past Medical History:  Diagnosis Date  . Anemia   . BPH (benign prostatic hyperplasia)   . GERD (gastroesophageal reflux disease)   . Gilbert's syndrome   . High cholesterol   . Hypertension   . Obstructive sleep apnea   . Post traumatic stress disorder   . Tinnitus     There are no problems to display for this patient.   Past Surgical History:  Procedure Laterality Date  . CYST EXCISION     right arm and back    Prior to Admission medications   Medication Sig Start Date End Date Taking? Authorizing Provider  alfuzosin (UROXATRAL) 10 MG 24 hr tablet Take 10 mg by mouth daily.   Yes [provider]  aspirin EC 81 MG tablet Take 81 mg by mouth daily.   Yes [provider]  esomeprazole (NEXIUM) 40 MG capsule Take 40 mg by mouth 2 (two) times daily. 10/15/15  Yes [provider]  hydrochlorothiazide (HYDRODIURIL) 25 MG tablet Take 25 mg by mouth daily. 10/10/19  Yes [provider]  lisinopril (PRINIVIL,ZESTRIL) 40 MG tablet Take 40 mg by mouth daily. 01/09/18  Yes  [provider]  NIFEdipine (PROCARDIA XL/NIFEDICAL-XL) 90 MG 24 hr tablet Take 90 mg by mouth daily. 08/21/19  Yes [provider]  simvastatin (ZOCOR) 20 MG tablet Take 20 mg by mouth at bedtime. 07/23/16  Yes [provider]  tamsulosin (FLOMAX) 0.4 MG CAPS capsule Take 0.4 mg by mouth at bedtime.   Yes [provider]  vitamin B-12 (CYANOCOBALAMIN) 1000 MCG tablet Take 1,000 mcg by mouth daily.   Yes [provider]  fluticasone (FLONASE) 50 MCG/ACT nasal spray Place 2 sprays into the nose daily. 09/18/17   [provider]  fluticasone (FLOVENT DISKUS) 50 MCG/BLIST diskus inhaler Inhale 2 puffs into the lungs daily.    [provider]  GNP D 1000 1000 units capsule Take 1,000 Units by mouth daily. 01/23/18   [provider]    Allergies Patient has no known allergies.  History reviewed. No pertinent family history.  Social History Social History   Tobacco Use  . Smoking status: Current Some Day Smoker    Types: Cigars  . Smokeless tobacco: Never Used  Substance Use Topics  . Alcohol use: Yes    Comment: every now and then  . Drug use: Never     Review of Systems  Constitutional: No fever/chills ENT: No upper respiratory complaints. Cardiovascular: Negative for chest pain. Respiratory: No SOB. Gastrointestinal: No nausea,  no vomiting.  Musculoskeletal: Positive for posterior neck stiffness and left upper arm pain. Skin: Negative for rash, abrasions, lacerations, ecchymosis. Neurological: Negative for headaches, numbness or tingling   ____________________________________________   PHYSICAL EXAM:  VITAL SIGNS: ED Triage Vitals  Enc Vitals Group     BP 12/25/19 1146 138/71     Pulse Rate 12/25/19 1146 96     Resp 12/25/19 1146 18     Temp 12/25/19 1146 99.3 F (37.4 C)     Temp Source 12/25/19 1146 Oral     SpO2 12/25/19 1146 96 %     Weight 12/25/19 1150 232 lb (105.2 kg)     Height 12/25/19 1150  6' (1.829 m)     Head Circumference --      Peak Flow --      Pain Score 12/25/19 1147 6     Pain Loc --      Pain Edu? --      Excl. in [place]? --      Constitutional: Alert and oriented. Well appearing and in no acute distress. Eyes: Conjunctivae are normal. PERRL. EOMI. Head: Atraumatic. ENT:      Ears:      Nose: No congestion/rhinnorhea.      Mouth/Throat: Mucous membranes are moist.  Neck: No stridor.  No tenderness to palpation to cervical spine.  Full range of motion of neck. Cardiovascular: Normal rate, regular rhythm.  Good peripheral circulation. Respiratory: Normal respiratory effort without tachypnea or retractions. Lungs CTAB. Good air entry to the bases with no decreased or absent breath sounds. Gastrointestinal: Bowel sounds 4 quadrants. Soft and nontender to palpation. No guarding or rigidity. No palpable masses. No distention. Musculoskeletal: Full range of motion to all extremities. No gross deformities appreciated.  No tenderness to palpation over left chest wall.  Strength equal in upper extremities bilaterally.  Pain elicited when patient uses his left arm to push himself up in bed.  No tenderness to palpation to left arm.  Mild tenderness to palpation to left cervical paraspinal muscles. Neurologic: Normal speech and language. No gross focal neurologic deficits are appreciated.  Cranial nerves: 2-10 normal as tested. Strength 5/5 in upper and lower extremities Cerebellar: Finger-nose-finger WNL, Heel to shin WNL Sensorimotor: No pronator drift, clonus, sensory loss or abnormal reflexes.  Speech: No dysarthria or expressive aphasia Skin:  Skin is warm, dry and intact.  Skin defect to posterior left skull without any surrounding erythema or drainage.  Nontender to palpation.   Psychiatric: Mood and affect are normal. Speech and behavior are normal. Patient exhibits appropriate insight and judgement.   ____________________________________________   LABS (all labs  ordered are listed, but only abnormal results are displayed)  Labs Reviewed  CBC - Abnormal; Notable for the following components:      Result Value   RBC 3.84 (*)    Hemoglobin 12.6 (*)    HCT 36.0 (*)    All other components within normal limits  COMPREHENSIVE METABOLIC PANEL - Abnormal; Notable for the following components:   Potassium 3.1 (*)    Glucose, Bld 138 (*)    Creatinine, Ser 1.67 (*)    Total Bilirubin 3.1 (*)    GFR calc non Af Amer 38 (*)    GFR calc Af Amer 44 (*)    All other components within normal limits  TROPONIN I (HIGH SENSITIVITY)  TROPONIN I (HIGH SENSITIVITY)   ____________________________________________  EKG  SR ____________________________________________  RADIOLOGY Robinette Haines, personally viewed and evaluated these  images (plain radiographs) as part of my medical decision making, as well as reviewing the written report by the radiologist.  DG Chest 2 View  Result Date: 12/25/2019 CLINICAL DATA:  Chest pain EXAM: CHEST - 2 VIEW COMPARISON:  08/03/2019, CT neck angiogram, 03/28/2018 FINDINGS: There is leftward deviation of the trachea. The heart size and mediastinal contours are otherwise within normal limits. Both lungs are clear. Disc degenerative disease of the thoracic spine. IMPRESSION: 1.  No acute abnormality of the lungs. 2. Leftward deviation of the trachea, in keeping with a large right lobe thyroid nodule seen on prior CT angiogram of the neck. Electronically Signed   By: Eddie Candle M.D.   On: 12/25/2019 13:09   CT Head Wo Contrast  Result Date: 12/25/2019 CLINICAL DATA:  Patient with right-sided headache, neck pain and stiffness. EXAM: CT HEAD WITHOUT CONTRAST CT CERVICAL SPINE WITHOUT CONTRAST TECHNIQUE: Multidetector CT imaging of the head and cervical spine was performed following the standard protocol without intravenous contrast. Multiplanar CT image reconstructions of the cervical spine were also generated. COMPARISON:  Brain CT  06/26/2013 FINDINGS: CT HEAD FINDINGS Brain: Ventricles and sulci are prominent compatible with atrophy. Periventricular and subcortical white matter hypodensities compatible with chronic microvascular ischemic changes. No evidence for acute cortically based infarct, intracranial hemorrhage, mass lesion or mass-effect. Vascular: Unremarkable Skull: Intact. Sinuses/Orbits: Mastoid air cells unremarkable. Paranasal sinuses well aerated. Orbits are unremarkable. Other: None. CT CERVICAL SPINE FINDINGS Alignment: Grade 1 anterolisthesis C4 on C5 likely secondary to facet degenerative changes. Skull base and vertebrae: Intact Soft tissues and spinal canal: No prevertebral fluid or swelling. No visible canal hematoma. Disc levels: Multilevel degenerative disc disease throughout the cervical spine. Multilevel facet degenerative changes. No acute fracture. Upper chest: Unremarkable. Other: There is a large 6 cm mass involving the right aspect of the thyroid coursing into the superior aspect of the mediastinum (image 84; series 3). IMPRESSION: 1. No acute intracranial process. Atrophy and chronic microvascular ischemic changes. 2. No acute cervical spine fracture. Multilevel degenerative disc disease. 3. Large mass involving the right aspect of the thyroid coursing into the superior aspect of the mediastinum. While this may potentially represent a goiter, the mass appears increased from prior exams. Recommend further evaluation with thyroid ultrasound. Electronically Signed   By: Lovey Newcomer M.D.   On: 12/25/2019 14:53   CT Cervical Spine Wo Contrast  Result Date: 12/25/2019 CLINICAL DATA:  Patient with right-sided headache, neck pain and stiffness. EXAM: CT HEAD WITHOUT CONTRAST CT CERVICAL SPINE WITHOUT CONTRAST TECHNIQUE: Multidetector CT imaging of the head and cervical spine was performed following the standard protocol without intravenous contrast. Multiplanar CT image reconstructions of the cervical spine were also  generated. COMPARISON:  Brain CT 06/26/2013 FINDINGS: CT HEAD FINDINGS Brain: Ventricles and sulci are prominent compatible with atrophy. Periventricular and subcortical white matter hypodensities compatible with chronic microvascular ischemic changes. No evidence for acute cortically based infarct, intracranial hemorrhage, mass lesion or mass-effect. Vascular: Unremarkable Skull: Intact. Sinuses/Orbits: Mastoid air cells unremarkable. Paranasal sinuses well aerated. Orbits are unremarkable. Other: None. CT CERVICAL SPINE FINDINGS Alignment: Grade 1 anterolisthesis C4 on C5 likely secondary to facet degenerative changes. Skull base and vertebrae: Intact Soft tissues and spinal canal: No prevertebral fluid or swelling. No visible canal hematoma. Disc levels: Multilevel degenerative disc disease throughout the cervical spine. Multilevel facet degenerative changes. No acute fracture. Upper chest: Unremarkable. Other: There is a large 6 cm mass involving the right aspect of the thyroid coursing into the  superior aspect of the mediastinum (image 84; series 3). IMPRESSION: 1. No acute intracranial process. Atrophy and chronic microvascular ischemic changes. 2. No acute cervical spine fracture. Multilevel degenerative disc disease. 3. Large mass involving the right aspect of the thyroid coursing into the superior aspect of the mediastinum. While this may potentially represent a goiter, the mass appears increased from prior exams. Recommend further evaluation with thyroid ultrasound. Electronically Signed   By: Lovey Newcomer M.D.   On: 12/25/2019 14:53   US Venous Img Upper Uni Left  Result Date: 12/25/2019 CLINICAL DATA:  Arm pain EXAM: LEFT UPPER EXTREMITY VENOUS DOPPLER ULTRASOUND TECHNIQUE: Gray-scale sonography with graded compression, as well as color Doppler and duplex ultrasound were performed to evaluate the upper extremity deep venous system from the level of the subclavian vein and including the jugular,  axillary, basilic, radial, ulnar and upper cephalic vein. Spectral Doppler was utilized to evaluate flow at rest and with distal augmentation maneuvers. COMPARISON:  None. FINDINGS: Contralateral Subclavian Vein: Respiratory phasicity is normal and symmetric with the symptomatic side. No evidence of thrombus. Normal compressibility. Internal Jugular Vein: No evidence of thrombus. Normal compressibility, respiratory phasicity and response to augmentation. Subclavian Vein: No evidence of thrombus. Normal compressibility, respiratory phasicity and response to augmentation. Axillary Vein: No evidence of thrombus. Normal compressibility, respiratory phasicity and response to augmentation. Cephalic Vein: No evidence of thrombus. Normal compressibility, respiratory phasicity and response to augmentation. Basilic Vein: No evidence of thrombus. Normal compressibility, respiratory phasicity and response to augmentation. Brachial Veins: No evidence of thrombus. Normal compressibility, respiratory phasicity and response to augmentation. Radial Veins: No evidence of thrombus. Normal compressibility, respiratory phasicity and response to augmentation. Ulnar Veins: No evidence of thrombus. Normal compressibility, respiratory phasicity and response to augmentation. Venous Reflux:  None visualized. Other Findings:  None visualized. IMPRESSION: No evidence of DVT within the left upper extremity. Electronically Signed   By: Lucrezia Europe M.D.   On: 12/25/2019 14:32    ____________________________________________    PROCEDURES  Procedure(s) performed:    Procedures    Medications  potassium chloride SA (KLOR-CON) CR tablet 40 mEq (40 mEq Oral Given 12/25/19 1559)     ____________________________________________   INITIAL IMPRESSION / ASSESSMENT AND PLAN / ED COURSE  Pertinent labs & imaging results that were available during my care of the patient were reviewed by me and considered in my medical decision making  (see chart for details).  Review of the [place] CSRS was performed in accordance of the [place] prior to dispensing any controlled drugs.   Patient presented to emergency department for evaluation of posterior neck stiffness that goes into left upper arm for several days.  Vital signs and exam are reassuring.  Patient had a boil earlier this week that had ruptured.  I do not see any indication of a current infection to the previous boil or surrounding tissue currently.  CBC largely unremarkable.  Patient was given a dose of potassium for potassium of 3.1.  Creatinine 1.67 and GFR 44, which is similar to patient's previous lab work.  Troponin within normal limits.  Normal sinus rhythm with no evidence of ischemia on EKG per MD.  Chest x-ray negative for acute cardiopulmonary processes.  Ultrasound negative for DVT.  CT scan without contrast shows an increasing mass to patient's thyroid.  Patient states that he was following with his provider at the [facility] for this mass.  They discussed doing a biopsy but decided not to unless mass started to cause him problems.  CT scan shows that this mass has been growing.  Patient last followed up with his primary care for the mass about 6 months ago.  Dr. Pryor Ochoa was called and consulted on patient CT results.  He does not recommend any additional imaging in the emergency department at this time, as this could delay the ultrasound and biopsy.  He will follow up with patient tomorrow for a biopsy or ultrasound early this week.  Patient's pain seems musculoskeletal to his posterior neck and upper arm and do not feel that this mass is contributing to his current symptoms.  I suspect that patient was holding his neck differently due to the boil in the back of his head, which was giving him the stiffness.  He denies any shortness of breath or difficulty swallowing.  Patient is agreeable with this plan.  Patient will be discharged home with prescriptions for ENT. Patient is to follow up with  ENT as directed.  Patient will call Dr. Pryor Ochoa in the morning.  Patient is given ED precautions to return to the ED for any worsening or new symptoms.  Pradyun F Corsetti was evaluated in Emergency Department on 12/26/2019 for the symptoms described in the history of present illness. He was evaluated in the context of the global COVID-19 pandemic, which necessitated consideration that the patient might be at risk for infection with the SARS-CoV-2 virus that causes COVID-19. Institutional protocols and algorithms that pertain to the evaluation of patients at risk for COVID-19 are in a state of rapid change based on information released by regulatory bodies including the CDC and federal and state organizations. These policies and algorithms were followed during the patient's care in the ED.   ____________________________________________  FINAL CLINICAL IMPRESSION(S) / ED DIAGNOSES  Final diagnoses:  Thyroid mass  Pain of left upper arm      NEW MEDICATIONS STARTED DURING THIS VISIT:  ED Discharge Orders    None          This chart was dictated using voice recognition software/Dragon. Despite best efforts to proofread, errors can occur which can change the meaning. Any change was purely unintentional.    Laban Emperor, PA-C 12/26/19 4287    Arta Silence, MD 12/26/19 2342

## 2019-12-25 NOTE — Discharge Instructions (Addendum)
The mass in your neck is enlarging.  Please call Dr. Milas Hock office in the morning for a follow-up appointment to have a biopsy or ultrasound tomorrow or Tuesday.  He will be expecting your call.  Please return to the emergency department immediately if you begin to have any shortness of breath or difficulty swallowing.

## 2019-12-25 NOTE — ED Notes (Signed)
Spoke to Dr. Kerman Passey about pt presentation. No orders at this time. Okay for flex.

## 2019-12-25 NOTE — ED Provider Notes (Signed)
ED ECG REPORT I, Arta Silence, the attending physician, personally viewed and interpreted this ECG.  Date: 12/25/2019 EKG Time: 1250 Rate: 80 Rhythm: normal sinus rhythm  QRS Axis: normal Intervals: normal ST/T Wave abnormalities: LVH with repolarization abnormality Narrative Interpretation: no evidence of acute ischemia    Arta Silence, MD 12/25/19 1304

## 2019-12-25 NOTE — ED Triage Notes (Signed)
Pt states started a week ago noticed a "pimple on my head" states pain went into neck and now is in L arm. Denies injury to L arm. Able to lift up L arm completely.

## 2019-12-25 NOTE — ED Notes (Signed)
Pt presents to the ED for L arm and side pain. Pt states he had a pimple on the back of his head a week ago. It popped and ever since then he has headache, L arm and side pain. Pt denies numbness and is able to move R arm fully. Pt A&Ox4 and NAD. Denies CP, SOB, and fevers.

## 2019-12-25 NOTE — ED Triage Notes (Signed)
C/o L arm pain. A&O, ambulatory with cane.

## 2020-04-04 ENCOUNTER — Encounter: Payer: Self-pay | Admitting: Gastroenterology

## 2020-04-04 ENCOUNTER — Ambulatory Visit: Payer: Non-veteran care | Admitting: Gastroenterology

## 2020-06-28 ENCOUNTER — Ambulatory Visit (INDEPENDENT_AMBULATORY_CARE_PROVIDER_SITE_OTHER): Payer: No Typology Code available for payment source | Admitting: Gastroenterology

## 2020-06-28 ENCOUNTER — Other Ambulatory Visit: Payer: Self-pay

## 2020-06-28 VITALS — BP 156/74 | HR 76 | Temp 98.0°F | Ht 72.0 in | Wt 234.0 lb

## 2020-06-28 DIAGNOSIS — R194 Change in bowel habit: Secondary | ICD-10-CM | POA: Diagnosis not present

## 2020-06-28 MED ORDER — NA SULFATE-K SULFATE-MG SULF 17.5-3.13-1.6 GM/177ML PO SOLN: 1.0000 | Freq: Once | ORAL | 0 refills | Status: AC

## 2020-06-28 NOTE — Progress Notes (Signed)
Jonathon Bellows MD, MRCP(U.K) [address]  [place]  [place], [place] [address]  Main: [phone]  Fax: [phone]   Gastroenterology Consultation  Referring Provider:     Gerome Sam* Primary Care Physician:  Dion Body, MD Primary Gastroenterologist:  Dr. Jonathon Bellows  Reason for Consultation:     Anemia and colon cancer screening        HPI:   Jose Becker is a 79 y.o. y/o male referred for consultation & management  by Dr. Dion Body, MD.     He has been referred for anemia and colon cancer screening.  Review of the labs in October 2021 demonstrated hemoglobin of 12.3 g.  Looking back at his labs previously in January 2021 his hemoglobin was 12.9 g in January 2017 was also 12.9 g 11 months back hemoglobin was 12.5 g.  I do not see any iron studies and his MCV is 93.86 months back and presently the MCV is 94.9.  Labs in July 2021 demonstrated a T4 of 0.63  He states that he noticed blood in his stool about 2 years back but did not come in to see [place] as the Covid pandemic began.  Since then no issues with blood in the stool.  Last colonoscopy was over 10 years back.  Recalls it was normal.  No unintentional weight loss.  He has been complaining a lot of gas and sometimes when he passes gas has fecal seepage.  This is new for him.  Denies excessive consumption of artificial sugars or sweeteners occasional use of string him.  Past Medical History:  Diagnosis Date  . Anemia   . BPH (benign prostatic hyperplasia)   . GERD (gastroesophageal reflux disease)   . Gilbert's syndrome   . High cholesterol   . Hypertension   . Obstructive sleep apnea   . Post traumatic stress disorder   . Tinnitus     Past Surgical History:  Procedure Laterality Date  . CYST EXCISION     right arm and back    Prior to Admission medications   Medication Sig Start Date End Date Taking? Authorizing Provider  alfuzosin (UROXATRAL) 10 MG 24 hr tablet Take 10 mg by  mouth daily.    [provider]  aspirin EC 81 MG tablet Take 81 mg by mouth daily.    [provider]  esomeprazole (NEXIUM) 40 MG capsule Take 40 mg by mouth 2 (two) times daily. 10/15/15   [provider]  fluticasone (FLONASE) 50 MCG/ACT nasal spray Place 2 sprays into the nose daily. 09/18/17   [provider]  fluticasone (FLOVENT DISKUS) 50 MCG/BLIST diskus inhaler Inhale 2 puffs into the lungs daily.    [provider]  GNP D 1000 1000 units capsule Take 1,000 Units by mouth daily. 01/23/18   [provider]  hydrochlorothiazide (HYDRODIURIL) 25 MG tablet Take 25 mg by mouth daily. 10/10/19   [provider]  lisinopril (PRINIVIL,ZESTRIL) 40 MG tablet Take 40 mg by mouth daily. 01/09/18   [provider]  NIFEdipine (PROCARDIA XL/NIFEDICAL-XL) 90 MG 24 hr tablet Take 90 mg by mouth daily. 08/21/19   [provider]  simvastatin (ZOCOR) 20 MG tablet Take 20 mg by mouth at bedtime. 07/23/16   [provider]  tamsulosin (FLOMAX) 0.4 MG CAPS capsule Take 0.4 mg by mouth at bedtime.    [provider]  vitamin B-12 (CYANOCOBALAMIN) 1000 MCG tablet Take 1,000 mcg by mouth daily.    [provider]    No family history on file.   Social History   Tobacco Use  . Smoking status: Current Some Day Smoker    Types: Cigars  . Smokeless tobacco: Never Used  Substance Use Topics  . Alcohol use: Yes    Comment: every now and then  . Drug use: Never    Allergies as of 06/28/2020  . (No Known Allergies)    Review of Systems:    All systems reviewed and negative except where noted in HPI.   Physical Exam:  There were no vitals taken for this visit. No LMP for male patient. Psych:  Alert and cooperative. Normal mood and affect. General:   Alert,  Well-developed, well-nourished, pleasant and cooperative in NAD Head:  Normocephalic and atraumatic. Eyes:  Sclera clear, no icterus.    Conjunctiva pink. Lungs:  Respirations even and unlabored.  Clear throughout to auscultation.   No wheezes, crackles, or rhonchi. No acute distress. Heart:  Regular rate and rhythm; no murmurs, clicks, rubs, or gallops. Abdomen:  Normal bowel sounds.  No bruits.  Soft, non-tender and non-distended without masses, hepatosplenomegaly or hernias noted.  No guarding or rebound tenderness.    Neurologic:  Alert and oriented x3;  grossly normal neurologically. Psych:  Alert and cooperative. Normal mood and affect.  Imaging Studies: No results found.  Assessment and Plan:   Jose Becker is a 79 y.o. y/o male has been referred for anemia.  I noted his labs which does not really indicate a significant anemia and his hemoglobin has been stable for over 3 years.  Presently had a hemoglobin of over 12 g and it is normocytic in nature.  I do not see any recent iron studies B12 or folate levels.In fact his TSH is normal and free T4 is low could be indicating a subclinical hypothyroidism.  Some change in his bowel movements which could be related to diet but in view of his age would warrant evaluation.  Plan 1.  Anemia stable for over 3 years suggest Dion Body, MD 2 check iron studies, B12 folate and if iron studies are low would require further GI evaluation if iron studies are normal and anemia still concerning could require evaluation with hematology for ruling out low-grade myelodysplasia.  Would not recommend GI work-up for normal [place] anemia with no iron deficiency.  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  Change in bowel habits would warrant a colonoscopy and also suggest him to start on a low FODMAP diet, activated charcoal tablets as needed.  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  The patient agrees with this plan & written consent will be obtained.     Follow up in 3 months  Dr Jonathon Bellows MD,MRCP(U.K)

## 2020-08-01 ENCOUNTER — Other Ambulatory Visit
Admission: RE | Admit: 2020-08-01 | Discharge: 2020-08-01 | Disposition: A | Discharge status: Home / Self Care | Payer: Medicare Other | Source: Ambulatory Visit | Attending: Gastroenterology | Admitting: Gastroenterology

## 2020-08-01 ENCOUNTER — Other Ambulatory Visit: Payer: Self-pay

## 2020-08-01 DIAGNOSIS — Z01812 Encounter for preprocedural laboratory examination: Secondary | ICD-10-CM | POA: Insufficient documentation

## 2020-08-01 DIAGNOSIS — Z20822 Contact with and (suspected) exposure to covid-19: Secondary | ICD-10-CM | POA: Insufficient documentation

## 2020-08-02 ENCOUNTER — Other Ambulatory Visit: Payer: Non-veteran care

## 2020-08-02 LAB — SARS CORONAVIRUS 2 (TAT 6-24 HRS): SARS Coronavirus 2: NEGATIVE

## 2020-08-03 ENCOUNTER — Encounter: Payer: Self-pay | Admitting: Gastroenterology

## 2020-08-06 ENCOUNTER — Encounter: Admission: RE | Payer: Self-pay | Source: Home / Self Care

## 2020-08-06 ENCOUNTER — Ambulatory Visit: Admission: RE | Admit: 2020-08-06 | Payer: Medicare Other | Source: Home / Self Care | Admitting: Gastroenterology

## 2020-08-06 SURGERY — COLONOSCOPY WITH PROPOFOL
Anesthesia: General

## 2020-10-03 ENCOUNTER — Ambulatory Visit: Payer: Non-veteran care | Admitting: Gastroenterology

## 2020-10-03 ENCOUNTER — Encounter: Payer: Self-pay | Admitting: Gastroenterology

## 2020-11-05 ENCOUNTER — Other Ambulatory Visit: Payer: Self-pay

## 2020-11-05 ENCOUNTER — Telehealth: Payer: Self-pay

## 2020-11-05 DIAGNOSIS — R194 Change in bowel habit: Secondary | ICD-10-CM

## 2020-11-05 IMAGING — CT CT HEAD W/O CM
3 series · 14 of 46 positions shown, 16 images · non-contrast
Comparison: Brain CT 06/26/2013

CLINICAL DATA: Patient with right-sided headache, neck pain and
stiffness.

EXAM:
CT HEAD WITHOUT CONTRAST
CT CERVICAL SPINE WITHOUT CONTRAST
TECHNIQUE: Multidetector CT imaging of the head and cervical spine was
performed following the standard protocol without intravenous
contrast. Multiplanar CT image reconstructions of the cervical spine
were also generated.

[Series 3: head wo · axial · 0.47mm/px · z∈[-135,-15]mm · 8 of 29 slices shown, 10 images]
[im 3/29  brain]
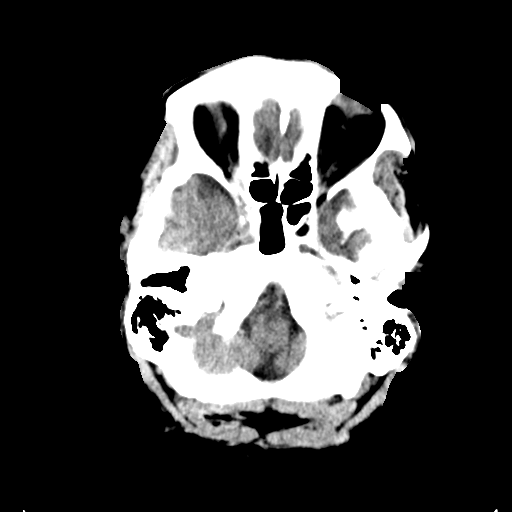
[im 3/29  bone]
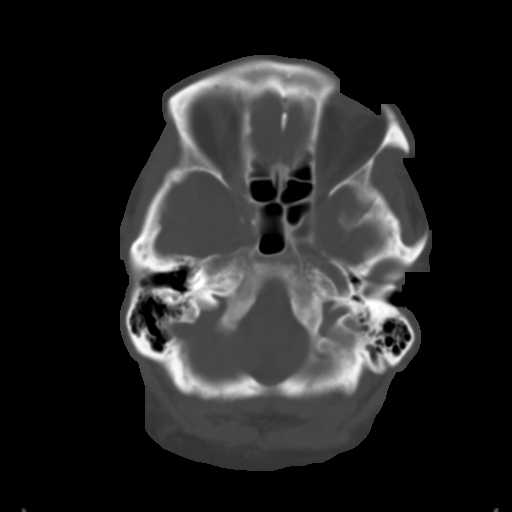
[im 7/29  brain]
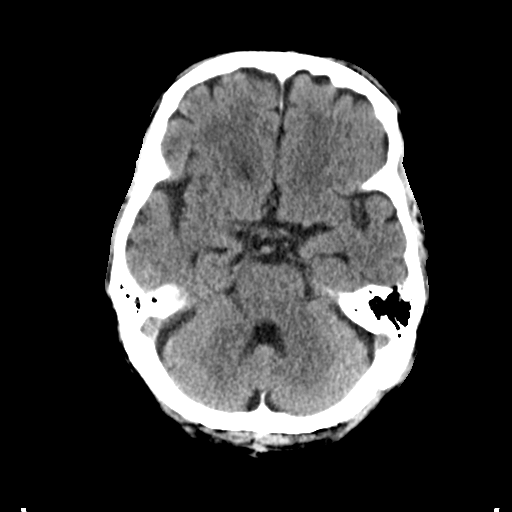
[im 10/29  brain]
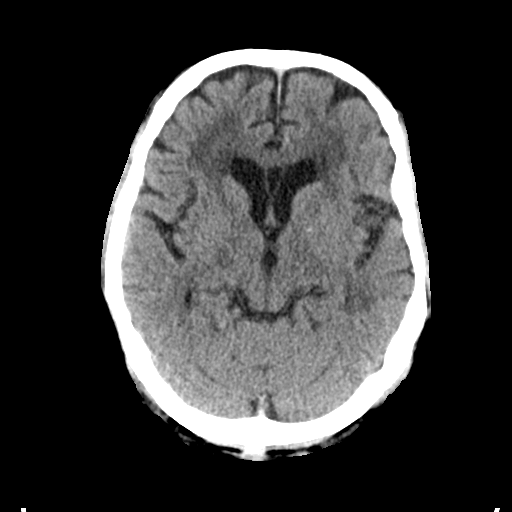
[im 13/29  brain]
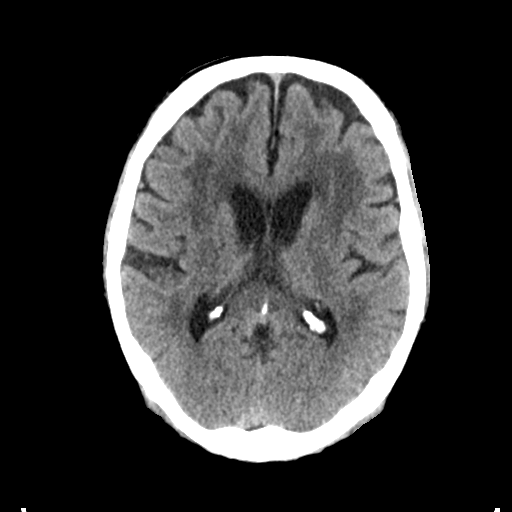
[im 17/29  brain]
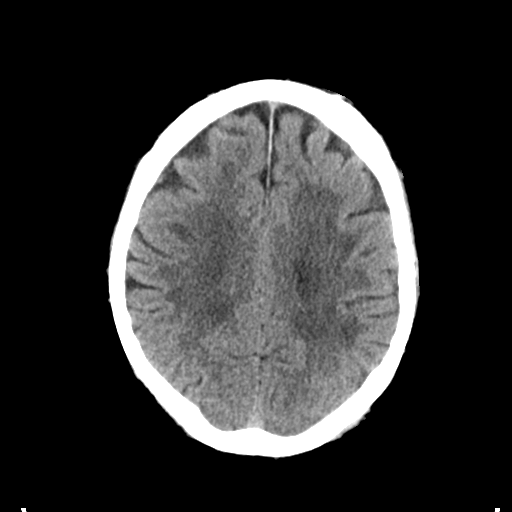
[im 17/29  bone]
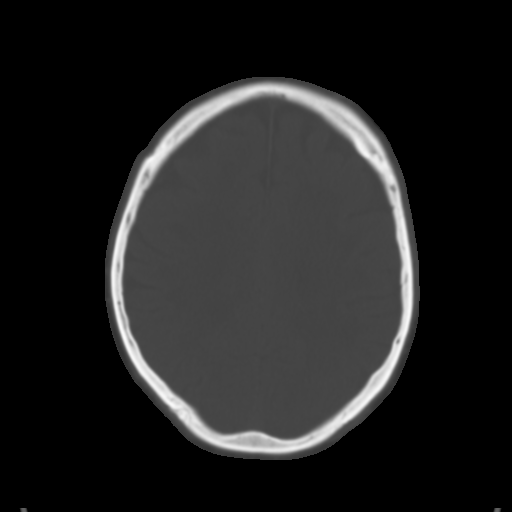
[im 20/29  brain]
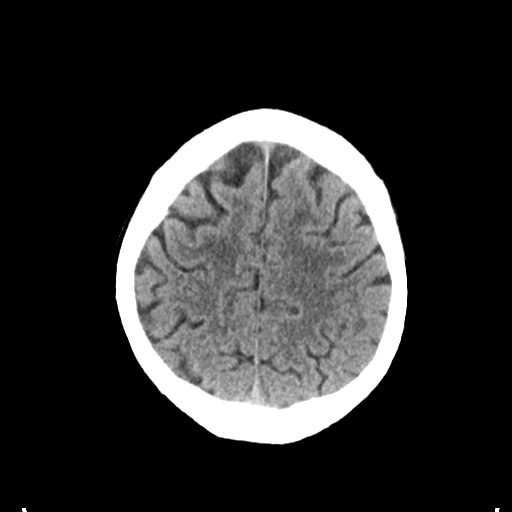
[im 23/29  brain]
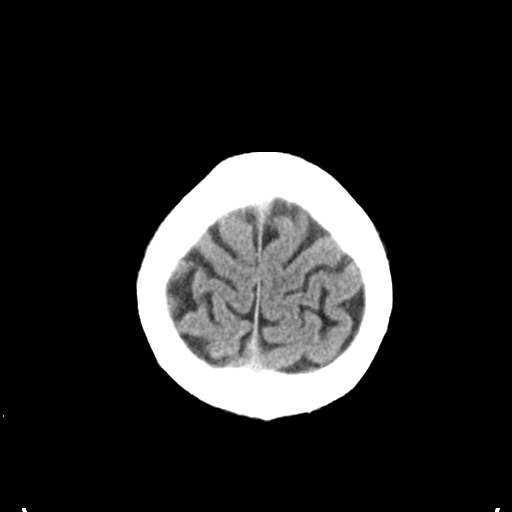
[im 27/29  brain]
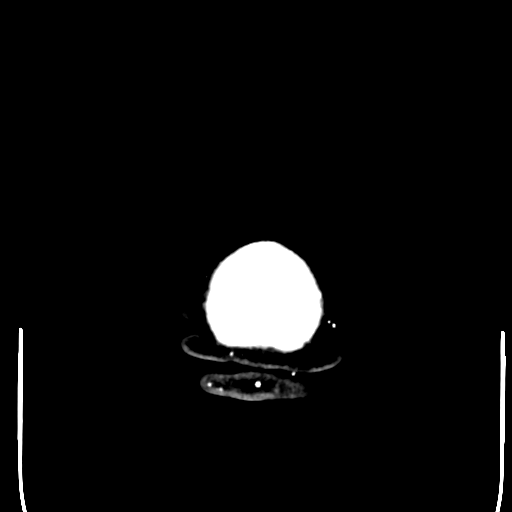

[Series 4: coronal soft tissue · coronal · 0.29mm/px · 3 of 68 slices shown]
[im 23/68  brain]
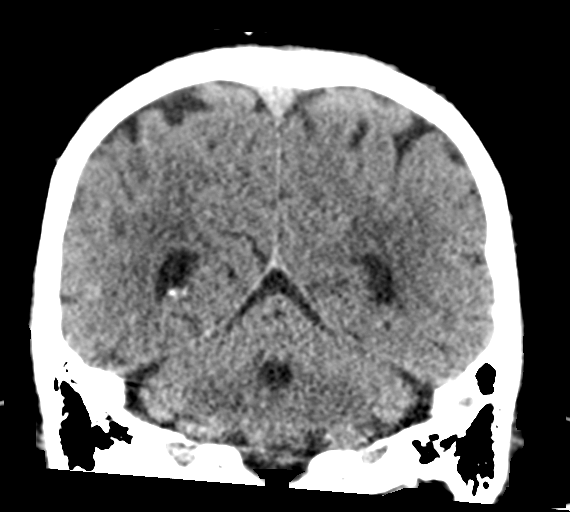
[im 30/68  brain]
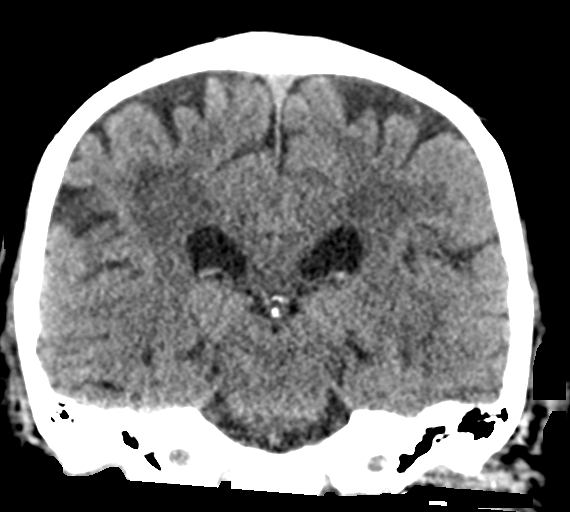
[im 38/68  brain]
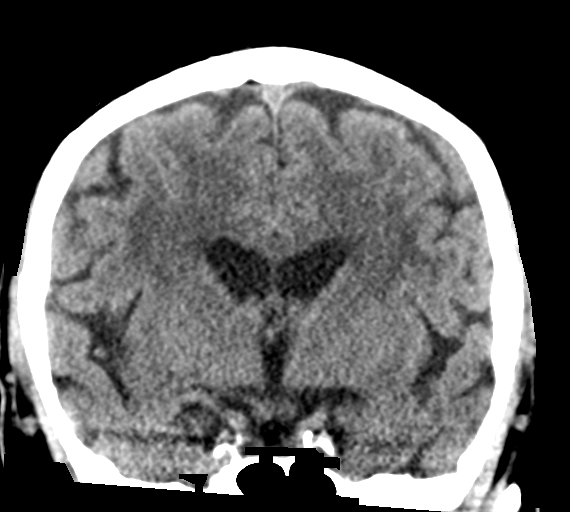

[Series 5: sagittal soft tissue · sagittal · 0.29mm/px · 3 of 56 slices shown]
[im 19/56  brain]
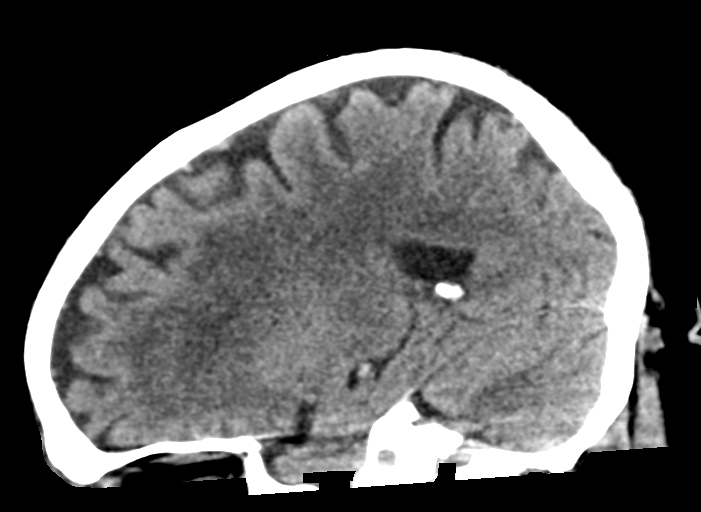
[im 28/56  brain]
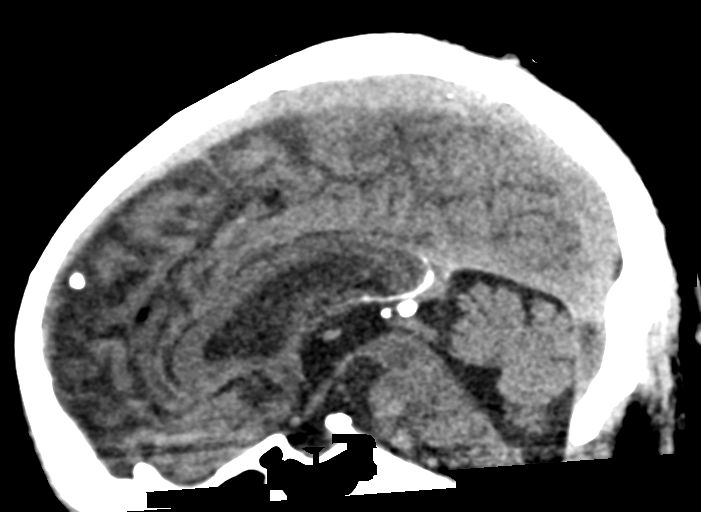
[im 37/56  brain]
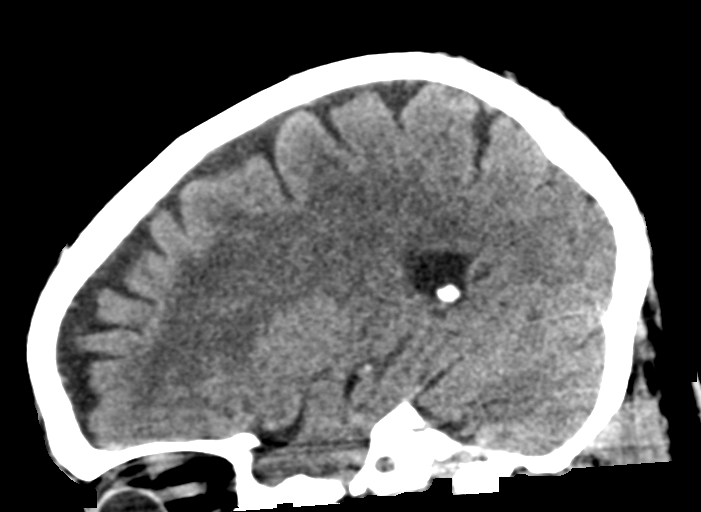

[14 of 46 positions shown; findings below may reference images not displayed]

FINDINGS: CT HEAD FINDINGS

Brain: Ventricles and sulci are prominent compatible with atrophy.
Periventricular and subcortical white matter hypodensities
compatible with chronic microvascular ischemic changes. No evidence
for acute cortically based infarct, intracranial hemorrhage, mass
lesion or mass-effect.

Vascular: Unremarkable

Skull: Intact.

Sinuses/Orbits: Mastoid air cells unremarkable. Paranasal sinuses
well aerated. Orbits are unremarkable.

Other: None.

CT CERVICAL SPINE FINDINGS

Alignment: Grade 1 anterolisthesis C4 on C5 likely secondary to
facet degenerative changes.

Skull base and vertebrae: Intact

Soft tissues and spinal canal: No prevertebral fluid or swelling. No
visible canal hematoma.

Disc levels: Multilevel degenerative disc disease throughout the
cervical spine. Multilevel facet degenerative changes. No acute
fracture.

Upper chest: Unremarkable.

Other: There is a large 6 cm mass involving the right aspect of the
thyroid coursing into the superior aspect of the mediastinum (image
84; series 3).
IMPRESSION: 1. No acute intracranial process. Atrophy and chronic microvascular
ischemic changes.
2. No acute cervical spine fracture. Multilevel degenerative disc
disease.
3. Large mass involving the right aspect of the thyroid coursing
into the superior aspect of the mediastinum. While this may
potentially represent a goiter, the mass appears increased from
prior exams. Recommend further evaluation with thyroid ultrasound.

## 2020-11-05 IMAGING — CT CT CERVICAL SPINE W/O CM
3 of 4 series · 13 of 33 positions shown, 16 images · non-contrast
Comparison: Brain CT 06/26/2013

CLINICAL DATA: Patient with right-sided headache, neck pain and
stiffness.

EXAM:
CT HEAD WITHOUT CONTRAST
CT CERVICAL SPINE WITHOUT CONTRAST
TECHNIQUE: Multidetector CT imaging of the head and cervical spine was
performed following the standard protocol without intravenous
contrast. Multiplanar CT image reconstructions of the cervical spine
were also generated.

[Series 4: sagittal bone · sagittal · 0.28mm/px · 5 of 81 slices shown, 6 images]
[im 27/81  bone]
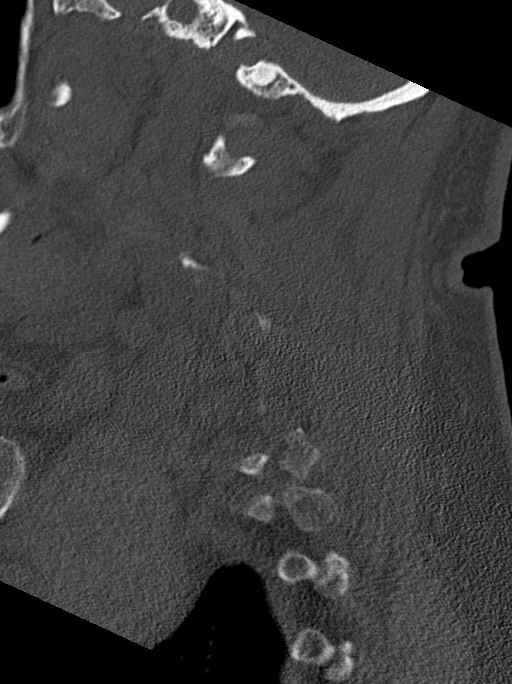
[im 34/81  bone]
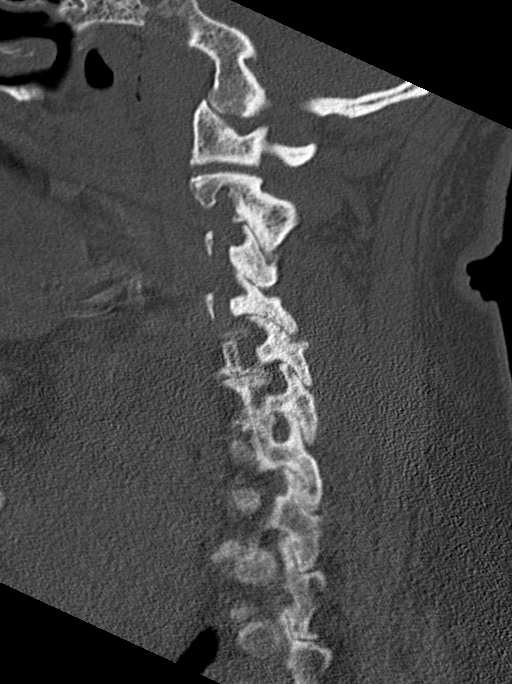
[im 41/81  soft-tissue]
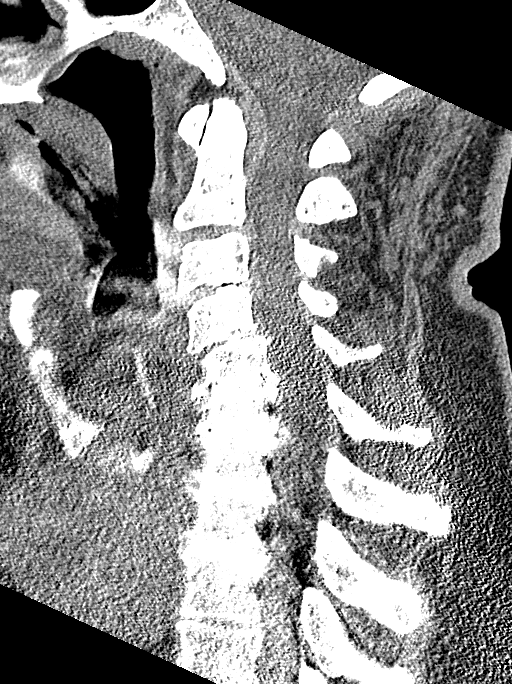
[im 41/81  bone]
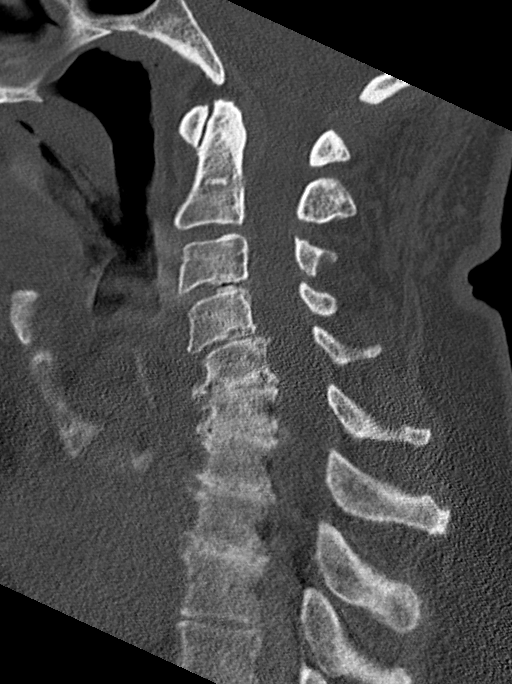
[im 47/81  bone]
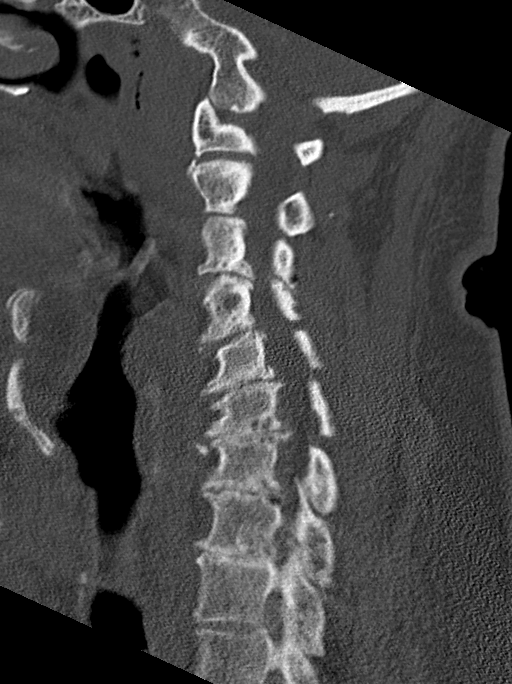
[im 54/81  bone]
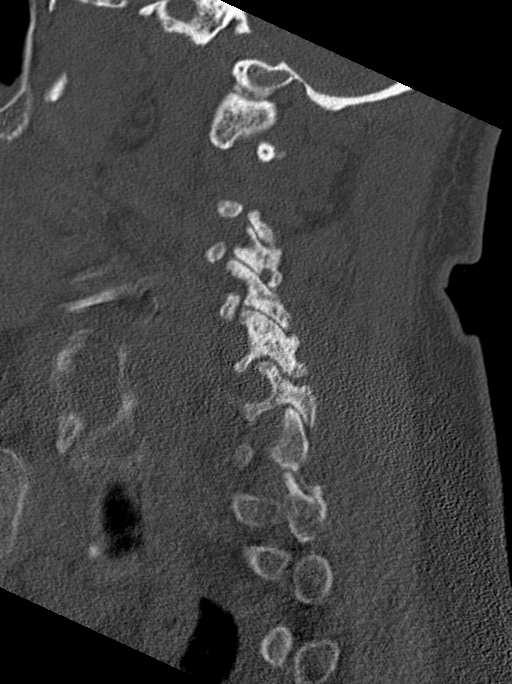

[Series 5: coronal bone · coronal · 0.32mm/px · 3 of 72 slices shown]
[im 18/72  bone]
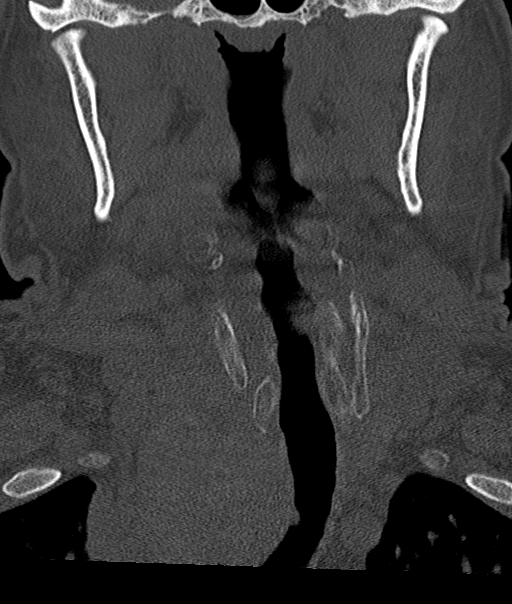
[im 30/72  bone]
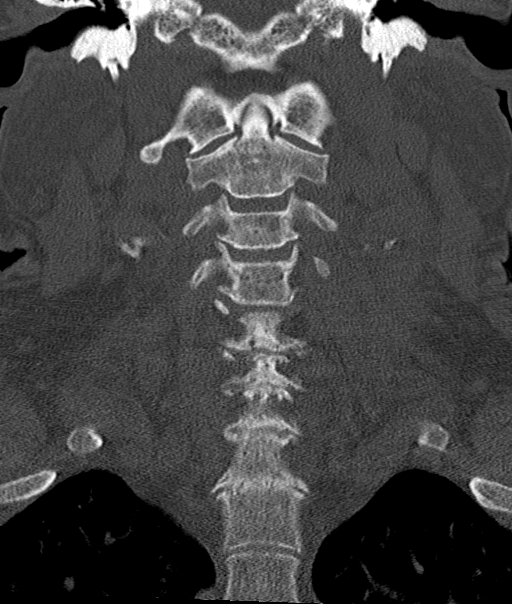
[im 42/72  bone]
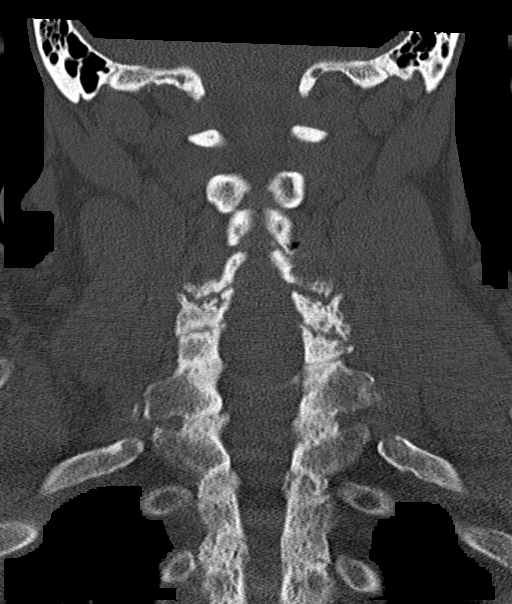

[Series 6: orthogonal bone · axial · 0.28mm/px · z∈[-315,-199]mm · 5 of 98 slices shown, 7 images]
[im 17/98  soft-tissue]
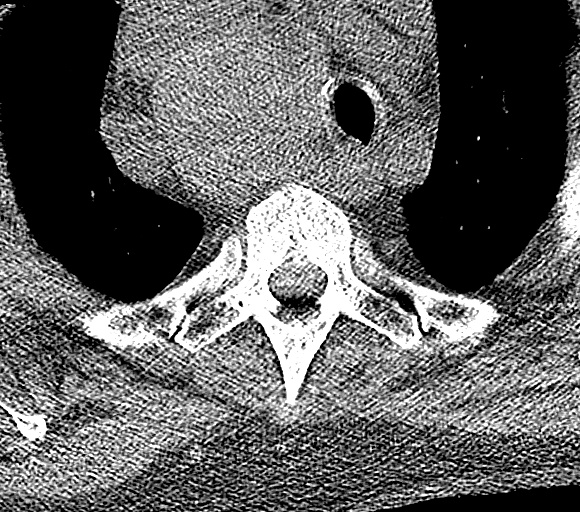
[im 17/98  bone]
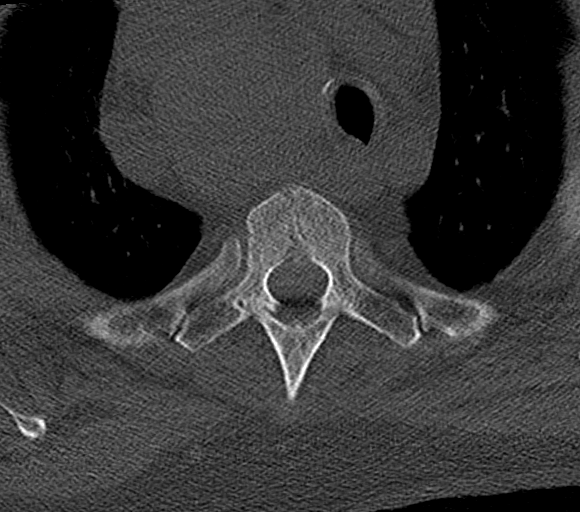
[im 33/98  bone]
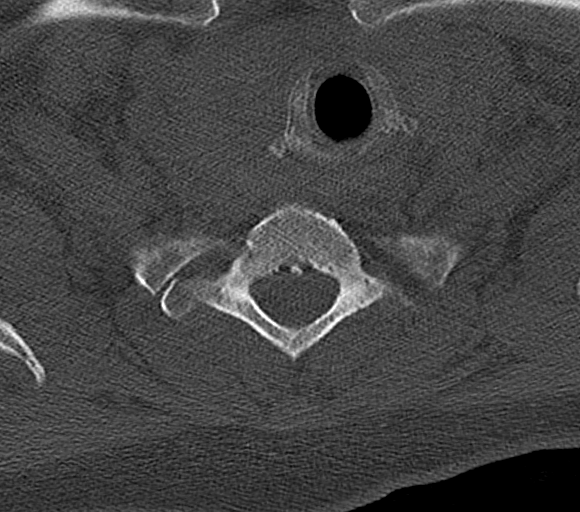
[im 49/98  bone]
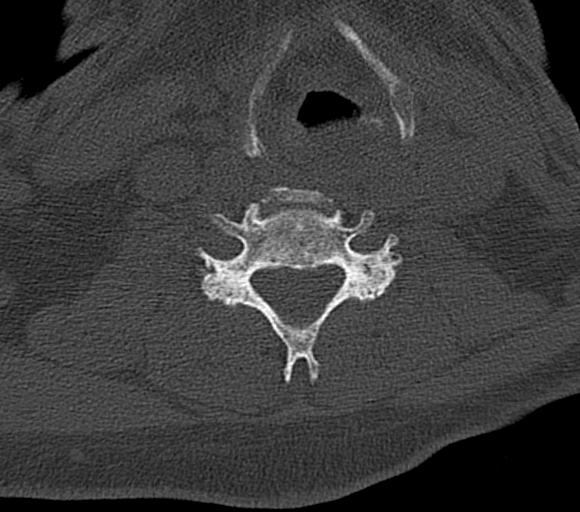
[im 65/98  bone]
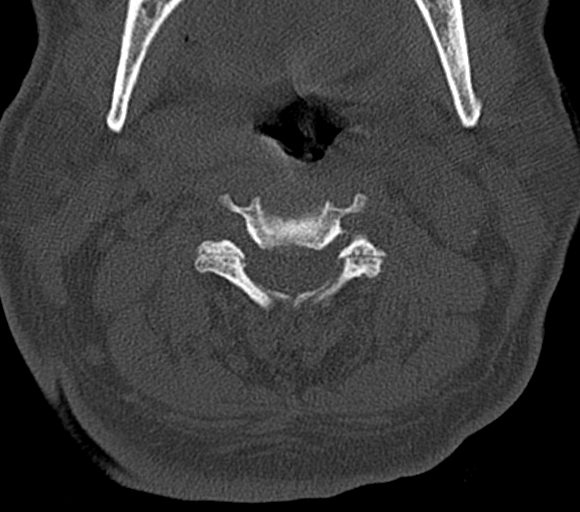
[im 81/98  soft-tissue]
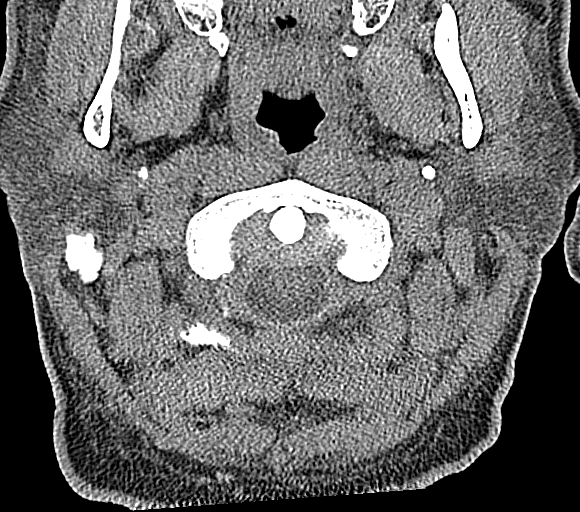
[im 81/98  bone]
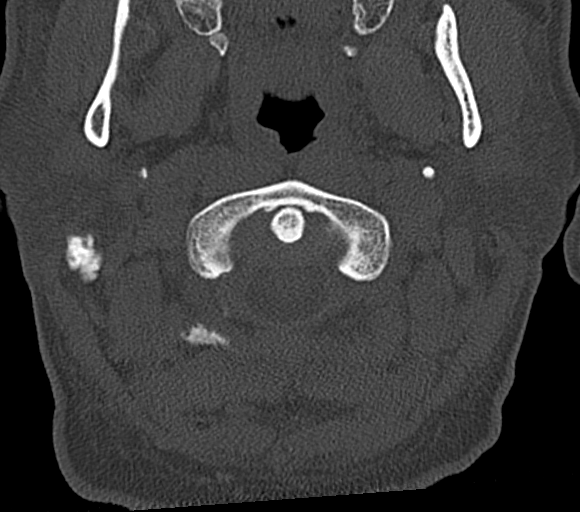

[13 of 33 positions shown; findings below may reference images not displayed]

FINDINGS: CT HEAD FINDINGS

Brain: Ventricles and sulci are prominent compatible with atrophy.
Periventricular and subcortical white matter hypodensities
compatible with chronic microvascular ischemic changes. No evidence
for acute cortically based infarct, intracranial hemorrhage, mass
lesion or mass-effect.

Vascular: Unremarkable

Skull: Intact.

Sinuses/Orbits: Mastoid air cells unremarkable. Paranasal sinuses
well aerated. Orbits are unremarkable.

Other: None.

CT CERVICAL SPINE FINDINGS

Alignment: Grade 1 anterolisthesis C4 on C5 likely secondary to
facet degenerative changes.

Skull base and vertebrae: Intact

Soft tissues and spinal canal: No prevertebral fluid or swelling. No
visible canal hematoma.

Disc levels: Multilevel degenerative disc disease throughout the
cervical spine. Multilevel facet degenerative changes. No acute
fracture.

Upper chest: Unremarkable.

Other: There is a large 6 cm mass involving the right aspect of the
thyroid coursing into the superior aspect of the mediastinum (image
84; series 3).
IMPRESSION: 1. No acute intracranial process. Atrophy and chronic microvascular
ischemic changes.
2. No acute cervical spine fracture. Multilevel degenerative disc
disease.
3. Large mass involving the right aspect of the thyroid coursing
into the superior aspect of the mediastinum. While this may
potentially represent a goiter, the mass appears increased from
prior exams. Recommend further evaluation with thyroid ultrasound.

## 2020-11-05 NOTE — Telephone Encounter (Signed)
Called patient to get procedure rescheduled. Pt states had been sick that is why procedure had not been rescheduled. Procedure has been scheduled and instructions will be mailed out. Informed patient to contact the [facility] to let them know his procedure has been scheduled. Pt verbalized understanding.

## 2020-11-05 NOTE — Telephone Encounter (Signed)
The [facility] left voicemail asking if patient completed the 10/03/20 appt? Also wants to speak with the clinical team to see if patient has decided to reschedule colonoscopy?  Patient was a No show on 10/03/20

## 2020-11-14 ENCOUNTER — Telehealth: Payer: Self-pay

## 2020-11-14 NOTE — Telephone Encounter (Signed)
Pt contacted office to let us know that he has to cancel his colonoscopy because the [facility] [facility] said they would not cover it.  He said they are going to send him somewhere else to have his colonoscopy performed.  He apologizes for the inconvenience.  Thanks,  [place], [place]

## 2020-11-15 NOTE — Telephone Encounter (Signed)
Procedure has been cancelled and Endo unit has been notified.

## 2020-12-14 ENCOUNTER — Ambulatory Visit: Admit: 2020-12-14 | Payer: Non-veteran care | Admitting: Gastroenterology

## 2020-12-14 SURGERY — COLONOSCOPY WITH PROPOFOL
Anesthesia: General

## 2020-12-25 ENCOUNTER — Encounter: Payer: Self-pay | Admitting: *Deleted

## 2021-01-02 ENCOUNTER — Other Ambulatory Visit
Admission: RE | Admit: 2021-01-02 | Discharge: 2021-01-02 | Disposition: A | Discharge status: Home / Self Care | Payer: Medicare Other | Source: Ambulatory Visit | Attending: Family Medicine | Admitting: Family Medicine

## 2021-01-02 ENCOUNTER — Telehealth: Payer: Self-pay

## 2021-01-02 DIAGNOSIS — R202 Paresthesia of skin: Secondary | ICD-10-CM | POA: Insufficient documentation

## 2021-01-02 DIAGNOSIS — R200 Anesthesia of skin: Secondary | ICD-10-CM | POA: Insufficient documentation

## 2021-01-02 DIAGNOSIS — R42 Dizziness and giddiness: Secondary | ICD-10-CM | POA: Diagnosis present

## 2021-01-02 LAB — TROPONIN I (HIGH SENSITIVITY): Troponin I (High Sensitivity): 11 ng/L (ref <18)

## 2021-01-02 NOTE — Telephone Encounter (Signed)
Patient came to office and states he is ready to reschedule his colonoscopy. Please call patient to reschedule

## 2021-01-03 ENCOUNTER — Other Ambulatory Visit: Payer: Self-pay

## 2021-01-03 DIAGNOSIS — D509 Iron deficiency anemia, unspecified: Secondary | ICD-10-CM

## 2021-01-03 DIAGNOSIS — Z1211 Encounter for screening for malignant neoplasm of colon: Secondary | ICD-10-CM

## 2021-01-03 NOTE — Telephone Encounter (Signed)
Patient was contacted and he chose to have his screening colonoscopy done on 02/04/2021 at [facility].

## 2021-01-30 ENCOUNTER — Telehealth: Payer: Self-pay

## 2021-01-30 NOTE — Telephone Encounter (Signed)
Patient daughter inlaw is calling to reschedule patient colonoscopy. Patient daughter inlaw is not on DPR but she states she will get him on the phone when you call. She states to call before 4pm today

## 2021-01-30 NOTE — Telephone Encounter (Signed)
Called patient back and was able to speak with him and his daughter-in-law and they both agreed on doing his procedure on 02/14/2021. I then called Trish to let her know to switch.

## 2021-02-13 ENCOUNTER — Telehealth: Payer: Self-pay | Admitting: Gastroenterology

## 2021-02-13 NOTE — Telephone Encounter (Signed)
Will need to r/s procedure for tomorrow as he ate breakfast this morning.

## 2021-02-14 ENCOUNTER — Encounter: Admission: RE | Payer: Self-pay | Source: Home / Self Care

## 2021-02-14 ENCOUNTER — Ambulatory Visit
Admission: RE | Admit: 2021-02-14 | Payer: No Typology Code available for payment source | Source: Home / Self Care | Admitting: Gastroenterology

## 2021-02-14 SURGERY — COLONOSCOPY WITH PROPOFOL
Anesthesia: General

## 2021-02-20 ENCOUNTER — Other Ambulatory Visit: Payer: Self-pay

## 2021-02-20 DIAGNOSIS — Z1211 Encounter for screening for malignant neoplasm of colon: Secondary | ICD-10-CM

## 2021-02-20 DIAGNOSIS — D509 Iron deficiency anemia, unspecified: Secondary | ICD-10-CM

## 2021-02-20 MED ORDER — NA SULFATE-K SULFATE-MG SULF 17.5-3.13-1.6 GM/177ML PO SOLN: 354.0000 mL | Freq: Once | ORAL | 0 refills | Status: AC

## 2021-02-20 NOTE — Telephone Encounter (Signed)
Called patient and got patient reschedule for 03/22/21 with Dr. Vicente Males. Went over instructions and mailed them. Sent prep to the pharmacy

## 2021-02-20 NOTE — Addendum Note (Signed)
Addended by: Ulyess Blossom L on: 02/20/2021 03:43 PM   Modules accepted: Orders

## 2021-03-21 ENCOUNTER — Encounter: Payer: Self-pay | Admitting: Gastroenterology

## 2021-03-22 ENCOUNTER — Ambulatory Visit
Admission: RE | Admit: 2021-03-22 | Discharge: 2021-03-22 | Disposition: A | Discharge status: Home / Self Care | Payer: No Typology Code available for payment source | Attending: Gastroenterology | Admitting: Gastroenterology

## 2021-03-22 ENCOUNTER — Encounter
Admission: RE | Disposition: A | Discharge status: Home / Self Care | Payer: Self-pay | Source: Home / Self Care | Attending: Gastroenterology

## 2021-03-22 ENCOUNTER — Other Ambulatory Visit: Payer: Self-pay

## 2021-03-22 ENCOUNTER — Ambulatory Visit: Payer: No Typology Code available for payment source | Admitting: Certified Registered Nurse Anesthetist

## 2021-03-22 ENCOUNTER — Encounter: Payer: Self-pay | Admitting: Gastroenterology

## 2021-03-22 DIAGNOSIS — K5730 Diverticulosis of large intestine without perforation or abscess without bleeding: Secondary | ICD-10-CM | POA: Insufficient documentation

## 2021-03-22 DIAGNOSIS — Z7951 Long term (current) use of inhaled steroids: Secondary | ICD-10-CM | POA: Insufficient documentation

## 2021-03-22 DIAGNOSIS — K635 Polyp of colon: Secondary | ICD-10-CM | POA: Diagnosis not present

## 2021-03-22 DIAGNOSIS — F17290 Nicotine dependence, other tobacco product, uncomplicated: Secondary | ICD-10-CM | POA: Diagnosis not present

## 2021-03-22 DIAGNOSIS — Z79899 Other long term (current) drug therapy: Secondary | ICD-10-CM | POA: Insufficient documentation

## 2021-03-22 DIAGNOSIS — D124 Benign neoplasm of descending colon: Secondary | ICD-10-CM

## 2021-03-22 DIAGNOSIS — Z1211 Encounter for screening for malignant neoplasm of colon: Secondary | ICD-10-CM | POA: Insufficient documentation

## 2021-03-22 DIAGNOSIS — D122 Benign neoplasm of ascending colon: Secondary | ICD-10-CM | POA: Diagnosis not present

## 2021-03-22 DIAGNOSIS — D509 Iron deficiency anemia, unspecified: Secondary | ICD-10-CM

## 2021-03-22 HISTORY — PX: COLONOSCOPY WITH PROPOFOL: SHX5780

## 2021-03-22 SURGERY — COLONOSCOPY WITH PROPOFOL
Anesthesia: General

## 2021-03-22 MED ORDER — SODIUM CHLORIDE (PF) 0.9 % IJ SOLN
INTRAMUSCULAR | Status: DC | PRN
  Administered 2021-03-22: 4 mL

## 2021-03-22 MED ORDER — PROPOFOL 500 MG/50ML IV EMUL
INTRAVENOUS | Status: AC
  Filled 2021-03-22: qty 50

## 2021-03-22 MED ORDER — SODIUM CHLORIDE 0.9 % IV SOLN: INTRAVENOUS | Status: DC

## 2021-03-22 MED ORDER — PROPOFOL 10 MG/ML IV BOLUS
INTRAVENOUS | Status: DC | PRN
  Administered 2021-03-22: 20 mg via INTRAVENOUS
  Administered 2021-03-22: 60 mg via INTRAVENOUS

## 2021-03-22 MED ORDER — LIDOCAINE HCL (CARDIAC) PF 100 MG/5ML IV SOSY
PREFILLED_SYRINGE | INTRAVENOUS | Status: DC | PRN
  Administered 2021-03-22: 50 mg via INTRAVENOUS

## 2021-03-22 MED ORDER — LIDOCAINE HCL (PF) 2 % IJ SOLN
INTRAMUSCULAR | Status: AC
  Filled 2021-03-22: qty 5

## 2021-03-22 MED ORDER — LIDOCAINE HCL (PF) 1 % IJ SOLN
INTRAMUSCULAR | Status: AC
  Filled 2021-03-22: qty 2

## 2021-03-22 MED ORDER — PROPOFOL 500 MG/50ML IV EMUL
INTRAVENOUS | Status: DC | PRN
  Administered 2021-03-22: 160 ug/kg/min via INTRAVENOUS

## 2021-03-22 NOTE — Anesthesia Postprocedure Evaluation (Signed)
Anesthesia Post Note  Patient: Akil F Kittler  Procedure(s) Performed: COLONOSCOPY WITH PROPOFOL  Patient location during evaluation: Endoscopy Anesthesia Type: General Level of consciousness: awake and alert Pain management: pain level controlled Vital Signs Assessment: post-procedure vital signs reviewed and stable Respiratory status: spontaneous breathing, nonlabored ventilation, respiratory function stable and patient connected to nasal cannula oxygen Cardiovascular status: blood pressure returned to baseline and stable Postop Assessment: no apparent nausea or vomiting Anesthetic complications: no   No notable events documented.   Last Vitals:  Vitals:   03/22/21 0933 03/22/21 0943  BP: 138/79 (!) 149/76  Pulse:    Resp:    Temp:    SpO2:      Last Pain:  Vitals:   03/22/21 0933  TempSrc:   PainSc: 0-No pain                 Precious Haws Jace Fermin

## 2021-03-22 NOTE — Op Note (Signed)
[facility] Patient Name: Jose Becker Procedure Date: 03/22/2021 8:38 AM MRN: [id number] Account #: [id number] Date of Birth: 20-Apr-1941 Admit Type: Outpatient Age: 80 Room: [facility] ENDO ROOM 4 Gender: Male Note Status: Finalized Instrument Name: Park Meo [id number] Procedure:             Colonoscopy Indications:           Screening for colorectal malignant neoplasm Providers:             Jonathon Bellows MD, MD Referring MD:          Dion Body (Referring MD) Medicines:             Monitored Anesthesia Care Complications:         No immediate complications. Procedure:             Pre-Anesthesia Assessment:                        - Prior to the procedure, a History and Physical was                         performed, and patient medications, allergies and                         sensitivities were reviewed. The patient's tolerance                         of previous anesthesia was reviewed.                        - The risks and benefits of the procedure and the                         sedation options and risks were discussed with the                         patient. All questions were answered and informed                         consent was obtained.                        - ASA Grade Assessment: II - A patient with mild                         systemic disease.                        After obtaining informed consent, the colonoscope was                         passed under direct vision. Throughout the procedure,                         the patient's blood pressure, pulse, and oxygen                         saturations were monitored continuously. The                         Colonoscope was introduced through  the anus and                         advanced to the the cecum, identified by the                         appendiceal orifice. The colonoscopy was performed                         with ease. The patient tolerated the procedure well.                          The quality of the bowel preparation was good. Findings:      The perianal and digital rectal examinations were normal.      Two semi-pedunculated polyps were found in the descending colon. The       polyps were 8 to 10 mm in size. These polyps were removed with a hot       snare. Resection and retrieval were complete.      Two sessile polyps were found in the ascending colon. The polyps were 4       to 6 mm in size. These polyps were removed with a cold snare. Resection       and retrieval were complete.      Two sessile polyps were found in the ascending colon. The polyps were 4       to 6 mm in size. These polyps were removed with a cold snare. Resection       and retrieval were complete.      Two semi-pedunculated polyps were found in the ascending colon. The       polyps were 8 to 10 mm in size. These polyps were removed with a hot       snare. Resection and retrieval were complete.      A 15 mm polyp was found in the ascending colon. The polyp was sessile.       Preparations were made for mucosal resection. Saline was injected to       raise the lesion. Snare mucosal resection was performed. Resection and       retrieval were complete. To close a defect after mucosal resection, one       hemostatic clip was successfully placed. There was no bleeding during,       or at the end, of the procedure. Borders of polyp marked using blue       light/NBI      Multiple small and large-mouthed diverticula were found in the entire       colon.      The exam was otherwise without abnormality on direct and retroflexion       views. Impression:            - Two 8 to 10 mm polyps in the descending colon,                         removed with a hot snare. Resected and retrieved.                        - Two 4 to 6 mm polyps in the ascending colon, removed  with a cold snare. Resected and retrieved.                        - Two 4 to 6 mm polyps in the  ascending colon, removed                         with a cold snare. Resected and retrieved.                        - Two 8 to 10 mm polyps in the ascending colon,                         removed with a hot snare. Resected and retrieved.                        - One 15 mm polyp in the ascending colon, removed with                         mucosal resection. Resected and retrieved. Clip was                         placed.                        - Diverticulosis in the entire examined colon.                        - The examination was otherwise normal on direct and                         retroflexion views.                        - Mucosal resection was performed. Resection and                         retrieval were complete. Recommendation:        - Discharge patient to home (with escort).                        - Resume previous diet.                        - Continue present medications.                        - Await pathology results.                        - Repeat colonoscopy for surveillance based on                         pathology results. Procedure Code(s):     --- Professional ---                        [phone], Colonoscopy, flexible; with endoscopic mucosal                         resection  [id number], 59, Colonoscopy, flexible; with removal of                         tumor(s), polyp(s), or other lesion(s) by snare                         technique Diagnosis Code(s):     --- Professional ---                        K63.5, Polyp of colon                        K57.30, Diverticulosis of large intestine without                         perforation or abscess without bleeding                        Z12.11, Encounter for screening for malignant neoplasm                         of colon CPT copyright 2019 American Medical Association. All rights reserved. The codes documented in this report are preliminary and upon coder review may  be revised to meet current  compliance requirements. Jonathon Bellows, MD Jonathon Bellows MD, MD 03/22/2021 9:27:27 AM This report has been signed electronically. Number of Addenda: 0 Note Initiated On: 03/22/2021 8:38 AM Scope Withdrawal Time: 0 hours 22 minutes 56 seconds  Total Procedure Duration: 0 hours 29 minutes 25 seconds  Estimated Blood Loss:  Estimated blood loss: none.      [facility]

## 2021-03-22 NOTE — H&P (Signed)
Jonathon Bellows, MD [address], [place], [place], [place], [address] [address], [place], [place], [place], [address] Phone: [phone]  Fax: [phone]  Primary Care Physician:  Dion Body, MD   Pre-Procedure History & Physical: HPI:  Jose Becker is a 80 y.o. male is here for an colonoscopy.   Past Medical History:  Diagnosis Date   Anemia    BPH (benign prostatic hyperplasia)    GERD (gastroesophageal reflux disease)    Gilbert's syndrome    High cholesterol    Hypertension    Obstructive sleep apnea    Post traumatic stress disorder    Tinnitus     Past Surgical History:  Procedure Laterality Date   COLONOSCOPY     CYST EXCISION     right arm and back    Prior to Admission medications   Medication Sig Start Date End Date Taking? Authorizing Provider  alfuzosin (UROXATRAL) 10 MG 24 hr tablet Take 10 mg by mouth daily.   Yes [provider]  aspirin EC 81 MG tablet Take 81 mg by mouth daily.   Yes [provider]  esomeprazole (NEXIUM) 40 MG capsule Take 40 mg by mouth 2 (two) times daily. 10/15/15  Yes [provider]  fluticasone (FLONASE) 50 MCG/ACT nasal spray Place 2 sprays into the nose daily. 09/18/17  Yes [provider]  fluticasone (FLOVENT DISKUS) 50 MCG/BLIST diskus inhaler Inhale 2 puffs into the lungs daily.   Yes [provider]  GNP D 1000 1000 units capsule Take 1,000 Units by mouth daily. 01/23/18  Yes [provider]  hydrochlorothiazide (HYDRODIURIL) 25 MG tablet Take 25 mg by mouth daily. 10/10/19  Yes [provider]  lisinopril (PRINIVIL,ZESTRIL) 40 MG tablet Take 40 mg by mouth daily. 01/09/18  Yes [provider]  NIFEdipine (PROCARDIA XL/NIFEDICAL-XL) 90 MG 24 hr tablet Take 90 mg by mouth daily. 08/21/19  Yes [provider]  simvastatin (ZOCOR) 20 MG tablet Take 20 mg by mouth at bedtime. 07/23/16  Yes [provider]  tamsulosin (FLOMAX)  0.4 MG CAPS capsule Take 0.4 mg by mouth at bedtime.   Yes [provider]  vitamin B-12 (CYANOCOBALAMIN) 1000 MCG tablet Take 1,000 mcg by mouth daily. Patient not taking: Reported on 06/28/2020    [provider]    Allergies as of 02/20/2021   (No Known Allergies)    History reviewed. No pertinent family history.  Social History   Socioeconomic History   Marital status: Single    Spouse name: Not on file   Number of children: Not on file   Years of education: Not on file   Highest education level: Not on file  Occupational History   Not on file  Tobacco Use   Smoking status: Some Days    Types: Cigars   Smokeless tobacco: Never  Vaping Use   Vaping Use: Never used  Substance and Sexual Activity   Alcohol use: Yes    Comment: every now and then   Drug use: Never   Sexual activity: Not on file  Other Topics Concern   Not on file  Social History Narrative   Not on file   Social Determinants of Health   Financial Resource Strain: Not on file  Food Insecurity: Not on file  Transportation Needs: Not on file  Physical Activity: Not on file  Stress: Not on file  Social Connections: Not on file  Intimate Partner Violence: Not on file  Review of Systems: See HPI, otherwise negative ROS  Physical Exam: BP (!) 168/80   Pulse 70   Temp 97.8 F (36.6 C) (Temporal)   Resp 16   Ht '6\' 2"'$  (1.88 m)   Wt 106.1 kg   SpO2 98%   BMI 30.04 kg/m  General:   Alert,  pleasant and cooperative in NAD Head:  Normocephalic and atraumatic. Neck:  Supple; no masses or thyromegaly. Lungs:  Clear throughout to auscultation, normal respiratory effort.    Heart:  +S1, +S2, Regular rate and rhythm, No edema. Abdomen:  Soft, nontender and nondistended. Normal bowel sounds, without guarding, and without rebound.   Neurologic:  Alert and  oriented x4;  grossly normal neurologically.  Impression/Plan: Ulysess F Wheeling is here for an colonoscopy to be performed for  Screening colonoscopy average risk   Risks, benefits, limitations, and alternatives regarding  colonoscopy have been reviewed with the patient.  Questions have been answered.  All parties agreeable.   Jonathon Bellows, MD  03/22/2021, 8:37 AM

## 2021-03-22 NOTE — Transfer of Care (Signed)
Immediate Anesthesia Transfer of Care Note  Patient: Jose Becker  Procedure(s) Performed: COLONOSCOPY WITH PROPOFOL  Patient Location: Endoscopy Unit  Anesthesia Type:General  Level of Consciousness: awake and sedated  Airway & Oxygen Therapy: Patient Spontanous Breathing and Patient connected to nasal cannula oxygen  Post-op Assessment: Report given to RN  Post vital signs: Reviewed and stable  Last Vitals:  Vitals Value Taken Time  BP 107/57 03/22/21 0924  Temp 35.7 C 03/22/21 0923  Pulse 65 03/22/21 0925  Resp 18 03/22/21 0925  SpO2 97 % 03/22/21 0925  Vitals shown include unvalidated device data.  Last Pain:  Vitals:   03/22/21 0923  TempSrc: Temporal  PainSc: Asleep         Complications: No notable events documented.

## 2021-03-22 NOTE — Anesthesia Preprocedure Evaluation (Signed)
Anesthesia Evaluation  Patient identified by MRN, date of birth, ID band Patient awake    Reviewed: Allergy & Precautions, NPO status , Patient's Chart, lab work & pertinent test results  History of Anesthesia Complications Negative for: history of anesthetic complications  Airway Mallampati: III  TM Distance: >3 FB Neck ROM: full    Dental  (+) Missing   Pulmonary neg shortness of breath, sleep apnea , Current Smoker,    Pulmonary exam normal        Cardiovascular Exercise Tolerance: Good hypertension, Normal cardiovascular exam     Neuro/Psych PSYCHIATRIC DISORDERS negative neurological ROS  negative psych ROS   GI/Hepatic Neg liver ROS, GERD  Medicated and Controlled,  Endo/Other  negative endocrine ROS  Renal/GU negative Renal ROS  negative genitourinary   Musculoskeletal   Abdominal   Peds  Hematology negative hematology ROS (+)   Anesthesia Other Findings Past Medical History: No date: Anemia No date: BPH (benign prostatic hyperplasia) No date: GERD (gastroesophageal reflux disease) No date: Gilbert's syndrome No date: High cholesterol No date: Hypertension No date: Obstructive sleep apnea No date: Post traumatic stress disorder No date: Tinnitus  Past Surgical History: No date: COLONOSCOPY No date: CYST EXCISION     Comment:  right arm and back  BMI    Body Mass Index: 30.04 kg/m      Reproductive/Obstetrics negative OB ROS                             Anesthesia Physical Anesthesia Plan  ASA: 3  Anesthesia Plan: General   Post-op Pain Management:    Induction: Intravenous  PONV Risk Score and Plan: Propofol infusion and TIVA  Airway Management Planned: Natural Airway and Nasal Cannula  Additional Equipment:   Intra-op Plan:   Post-operative Plan:   Informed Consent: I have reviewed the patients History and Physical, chart, labs and discussed the  procedure including the risks, benefits and alternatives for the proposed anesthesia with the patient or authorized representative who has indicated his/her understanding and acceptance.     Dental Advisory Given  Plan Discussed with: Anesthesiologist, CRNA and Surgeon  Anesthesia Plan Comments: (Patient consented for risks of anesthesia including but not limited to:  - adverse reactions to medications - risk of airway placement if required - damage to eyes, teeth, lips or other oral mucosa - nerve damage due to positioning  - sore throat or hoarseness - Damage to heart, brain, nerves, lungs, other parts of body or loss of life  Patient voiced understanding.)        Anesthesia Quick Evaluation

## 2021-03-26 LAB — SURGICAL PATHOLOGY

## 2021-03-29 ENCOUNTER — Telehealth: Payer: Self-pay

## 2021-03-29 NOTE — Telephone Encounter (Signed)
Called patient to give him his colonoscopy results and what Dr. Vicente Males recommends. Patient agreed and he will be out on our recall list to remind him in a year to repeat his colonoscopy.

## 2021-03-29 NOTE — Telephone Encounter (Signed)
-----   Message from Jonathon Bellows, MD sent at 03/28/2021  2:35 PM EDT ----- Inform the patient that he had over 10 polyps all of them were precancerous.  In view of the large number I would recommend repeat colonoscopy in 1 year please

## 2023-03-05 ENCOUNTER — Encounter: Payer: Self-pay | Admitting: *Deleted

## 2023-03-05 ENCOUNTER — Emergency Department
Admission: EM | Admit: 2023-03-05 | Discharge: 2023-03-05 | Disposition: A | Discharge status: Home / Self Care | Payer: No Typology Code available for payment source | Attending: Emergency Medicine | Admitting: Emergency Medicine

## 2023-03-05 ENCOUNTER — Other Ambulatory Visit: Payer: Self-pay

## 2023-03-05 DIAGNOSIS — Y92009 Unspecified place in unspecified non-institutional (private) residence as the place of occurrence of the external cause: Secondary | ICD-10-CM | POA: Diagnosis not present

## 2023-03-05 DIAGNOSIS — Z5321 Procedure and treatment not carried out due to patient leaving prior to being seen by health care provider: Secondary | ICD-10-CM | POA: Insufficient documentation

## 2023-03-05 DIAGNOSIS — W19XXXA Unspecified fall, initial encounter: Secondary | ICD-10-CM | POA: Insufficient documentation

## 2023-03-05 DIAGNOSIS — M545 Low back pain, unspecified: Secondary | ICD-10-CM | POA: Diagnosis present

## 2023-03-05 LAB — BASIC METABOLIC PANEL
Anion gap: 11 (ref 5–15)
BUN: 22 mg/dL (ref 8–23)
CO2: 24 mmol/L (ref 22–32)
Calcium: 9.4 mg/dL (ref 8.9–10.3)
Chloride: 104 mmol/L (ref 98–111)
Creatinine, Ser: 1.31 mg/dL — ABNORMAL HIGH (ref 0.61–1.24)
GFR, Estimated: 54 mL/min — ABNORMAL LOW (ref >60)
Glucose, Bld: 114 mg/dL — ABNORMAL HIGH (ref 70–99)
Potassium: 2.9 mmol/L — ABNORMAL LOW (ref 3.5–5.1)
Sodium: 139 mmol/L (ref 135–145)

## 2023-03-05 LAB — CBC
HCT: 36.7 % — ABNORMAL LOW (ref 39.0–52.0)
Hemoglobin: 12.8 g/dL — ABNORMAL LOW (ref 13.0–17.0)
MCH: 34.5 pg — ABNORMAL HIGH (ref 26.0–34.0)
MCHC: 34.9 g/dL (ref 30.0–36.0)
MCV: 98.9 fL (ref 80.0–100.0)
Platelets: 352 10*3/uL (ref 150–400)
RBC: 3.71 MIL/uL — ABNORMAL LOW (ref 4.22–5.81)
RDW: 13.5 % (ref 11.5–15.5)
WBC: 9.3 10*3/uL (ref 4.0–10.5)
nRBC: 0 % (ref 0.0–0.2)

## 2023-03-05 NOTE — ED Triage Notes (Signed)
First Nurse Note :  Pt via [facility] from home. Pt c/o bilateral leg pain for the past 3 days, states he is unable to walk today. Pt had fluid pulled off the L knee yesterday.  74 HR  99% on RA 138/83 BP  Pt is A&Ox4 and NAD

## 2023-03-05 NOTE — ED Triage Notes (Signed)
Pt brought in via ems from home.  Pt reports 3 falls in 4 days at home.  Pt had fluid drawn off left knee 2 days ago.  Pt also reports lower back pain.  Denies urinary sx.   Pt alert  speech clear.

## 2023-03-16 ENCOUNTER — Other Ambulatory Visit: Payer: Self-pay | Admitting: Family Medicine

## 2023-03-16 DIAGNOSIS — R531 Weakness: Secondary | ICD-10-CM

## 2023-03-20 ENCOUNTER — Emergency Department
Admission: EM | Admit: 2023-03-20 | Discharge: 2023-03-20 | Disposition: A | Discharge status: Home / Self Care | Payer: No Typology Code available for payment source | Attending: Emergency Medicine | Admitting: Emergency Medicine

## 2023-03-20 ENCOUNTER — Other Ambulatory Visit: Payer: Self-pay

## 2023-03-20 ENCOUNTER — Emergency Department: Payer: No Typology Code available for payment source

## 2023-03-20 DIAGNOSIS — I10 Essential (primary) hypertension: Secondary | ICD-10-CM | POA: Insufficient documentation

## 2023-03-20 DIAGNOSIS — R009 Unspecified abnormalities of heart beat: Secondary | ICD-10-CM | POA: Diagnosis present

## 2023-03-20 DIAGNOSIS — M21332 Wrist drop, left wrist: Secondary | ICD-10-CM | POA: Insufficient documentation

## 2023-03-20 LAB — CBC
HCT: 33.2 % — ABNORMAL LOW (ref 39.0–52.0)
Hemoglobin: 11.8 g/dL — ABNORMAL LOW (ref 13.0–17.0)
MCH: 34.3 pg — ABNORMAL HIGH (ref 26.0–34.0)
MCHC: 35.5 g/dL (ref 30.0–36.0)
MCV: 96.5 fL (ref 80.0–100.0)
Platelets: 300 10*3/uL (ref 150–400)
RBC: 3.44 MIL/uL — ABNORMAL LOW (ref 4.22–5.81)
RDW: 13.8 % (ref 11.5–15.5)
WBC: 17.5 10*3/uL — ABNORMAL HIGH (ref 4.0–10.5)
nRBC: 0 % (ref 0.0–0.2)

## 2023-03-20 LAB — BASIC METABOLIC PANEL
Anion gap: 5 (ref 5–15)
BUN: 24 mg/dL — ABNORMAL HIGH (ref 8–23)
CO2: 23 mmol/L (ref 22–32)
Calcium: 9.1 mg/dL (ref 8.9–10.3)
Chloride: 109 mmol/L (ref 98–111)
Creatinine, Ser: 1.14 mg/dL (ref 0.61–1.24)
GFR, Estimated: >60 mL/min (ref >60)
Glucose, Bld: 107 mg/dL — ABNORMAL HIGH (ref 70–99)
Potassium: 3.2 mmol/L — ABNORMAL LOW (ref 3.5–5.1)
Sodium: 137 mmol/L (ref 135–145)

## 2023-03-20 LAB — TROPONIN I (HIGH SENSITIVITY)
Troponin I (High Sensitivity): 11 ng/L (ref <18)
Troponin I (High Sensitivity): 12 ng/L (ref <18)

## 2023-03-20 NOTE — ED Notes (Signed)
Lab called to request blood draw.

## 2023-03-20 NOTE — Discharge Instructions (Signed)
Please follow-up with your heart doctor as well as neurology by calling the number provided to arrange a follow-up appointment.  Return to the emergency department for any further weakness numbness of any arm or leg confusion or difficulty speaking.  Also return to the emergency department for any fever cough or congestion.  Otherwise follow-up with your primary care doctor in the next several days for recheck.

## 2023-03-20 NOTE — ED Provider Notes (Signed)
[facility] Provider Note    Event Date/Time   First MD Initiated Contact with Patient 03/20/23 1644     (approximate)  History   Chief Complaint: Palpitations  HPI  Jose Becker is a 82 y.o. male with a past medical history of anemia, gastric reflux, hypertension, hyperlipidemia, presents to the emergency department for an irregular heartbeat noted while at physical therapy.  According to the patient and family he was at physical therapy when they noted the patient to have an irregular heartbeat and sent the patient to the emergency department.  Patient states a history of an irregular heartbeat, states he was told by his doctor that he could have atrial fibrillation however has been seen by Dr. Juliann Pares of cardiology he states multiple times and has been told that he is not in atrial fibrillation.  Patient denies any chest pain or shortness of breath at any point today.  As a secondary complaint patient states for the past 1 week he has had left arm weakness and has trouble extending his fingers.  He was told by his doctor that he likely had a stroke and has an MRI scheduled for next week.  Denies any headache.  Denies any trouble walking.  No confusion.  No speech difficulty.  Physical Exam   Triage Vital Signs: ED Triage Vitals  Encounter Vitals Group     BP 03/20/23 1250 (!) 140/72     Systolic BP Percentile --      Diastolic BP Percentile --      Pulse Rate 03/20/23 1250 (!) 50     Resp 03/20/23 1250 15     Temp 03/20/23 1250 98.7 F (37.1 C)     Temp Source 03/20/23 1250 Oral     SpO2 03/20/23 1250 96 %     Weight 03/20/23 1255 205 lb 0.4 oz (93 kg)     Height 03/20/23 1255 6' (1.829 m)     Head Circumference --      Peak Flow --      Pain Score 03/20/23 1255 0     Pain Loc --      Pain Education --      Exclude from Growth Chart --     Most recent vital signs: Vitals:   03/20/23 1507 03/20/23 1700  BP: 136/88 (!) 166/85  Pulse: (!) 56  (!) 37  Resp: 16 17  Temp:    SpO2: 98%     General: Awake, no distress.  CV:  Good peripheral perfusion.  Regular rate and rhythm  Resp:  Normal effort.  Equal breath sounds bilaterally.  Abd:  No distention.  Soft, nontender.  No rebound or guarding. Other:  Neurological exam does show intact grip strength bilaterally.  Patient is unable to fully extend the fingers of the left hand and appears to have a wrist drop on exam.  No lower extremity symptoms, cranial nerves appear intact.   ED Results / Procedures / Treatments   EKG  EKG viewed and interpreted by myself shows a sinus rhythm at 80 bpm with a narrow QRS, normal axis, normal intervals, nonspecific ST changes, occasional PVC.  RADIOLOGY  I have reviewed and interpreted the MRI images.  I do not see any obvious acute infarct on T2 imaging.  Radiology is read the MRI is negative. Chest x-ray shows lung base opacities.  Patient denies any cough congestion or fever.   MEDICATIONS ORDERED IN ED: Medications - No data to display  IMPRESSION / MDM / ASSESSMENT AND PLAN / ED COURSE  I reviewed the triage vital signs and the nursing notes.  Patient's presentation is most consistent with acute presentation with potential threat to life or bodily function.  Patient presents emergency department for an irregular heartbeat noted to physical therapy.  Patient states a history of an irregular heartbeat has been told by cardiology that it is not atrial fibrillation.  On my evaluation the patient is EKG as well as telemetry monitoring in the emergency department he appears to have P waves on occasion in front of QRS complexes and occasional ectopic beats.  I suspect that the patient is more than a sinus arrhythmia with ectopic beats instead of atrial fibrillation.  However given the patient's complaint of left arm weakness in addition to his irregular heartbeat we will proceed with an MRI to evaluate for possible CVA.  Patient's lab work  in the emergency department has resulted showing leukocytosis with a white blood cell count of 17,000, I spoke to the patient more about this he states he just finished a course of steroids last week as well as a knee injection of steroids.  Denies any infectious symptoms.  Patient's chemistry shows no concerning findings and troponin is negative x 2.  Patient's MRI has resulted showing no acute CVA.  Given the patient's reassuring MRI I highly suspect patient is experiencing wrist drop due to a peripheral nerve issue.  We will refer to neurology.  Given the patient's otherwise reassuring workup I believe the patient is safe for discharge home with outpatient follow-up.  FINAL CLINICAL IMPRESSION(S) / ED DIAGNOSES   Wrist drop  Note:  This document was prepared using Dragon voice recognition software and may include unintentional dictation errors.   Minna Antis, MD 03/20/23 [phone]

## 2023-03-20 NOTE — ED Triage Notes (Signed)
Pt presents to ED with c/o of being told to come due to ED with c/o of irregular heart beat during home PT, pt states HX of a-fib.

## 2023-03-24 ENCOUNTER — Ambulatory Visit: Admission: RE | Admit: 2023-03-24 | Payer: Medicare Other | Source: Ambulatory Visit

## 2023-03-24 LAB — CBG MONITORING, ED: Glucose-Capillary: 105 mg/dL — ABNORMAL HIGH (ref 70–99)

## 2023-05-05 ENCOUNTER — Emergency Department
Admission: EM | Admit: 2023-05-05 | Discharge: 2023-05-05 | Disposition: A | Discharge status: Home / Self Care | Payer: No Typology Code available for payment source | Attending: Emergency Medicine | Admitting: Emergency Medicine

## 2023-05-05 ENCOUNTER — Other Ambulatory Visit: Payer: Self-pay

## 2023-05-05 ENCOUNTER — Emergency Department: Payer: No Typology Code available for payment source

## 2023-05-05 DIAGNOSIS — R519 Headache, unspecified: Secondary | ICD-10-CM | POA: Diagnosis not present

## 2023-05-05 DIAGNOSIS — N390 Urinary tract infection, site not specified: Secondary | ICD-10-CM

## 2023-05-05 DIAGNOSIS — I4891 Unspecified atrial fibrillation: Secondary | ICD-10-CM | POA: Insufficient documentation

## 2023-05-05 DIAGNOSIS — W19XXXA Unspecified fall, initial encounter: Secondary | ICD-10-CM | POA: Insufficient documentation

## 2023-05-05 DIAGNOSIS — R410 Disorientation, unspecified: Secondary | ICD-10-CM | POA: Insufficient documentation

## 2023-05-05 LAB — CBC WITH DIFFERENTIAL/PLATELET
Abs Immature Granulocytes: 0.05 10*3/uL (ref 0.00–0.07)
Basophils Absolute: 0.1 10*3/uL (ref 0.0–0.1)
Basophils Relative: 1 %
Eosinophils Absolute: 0.1 10*3/uL (ref 0.0–0.5)
Eosinophils Relative: 1 %
HCT: 38.2 % — ABNORMAL LOW (ref 39.0–52.0)
Hemoglobin: 12.2 g/dL — ABNORMAL LOW (ref 13.0–17.0)
Immature Granulocytes: 1 %
Lymphocytes Relative: 30 %
Lymphs Abs: 2.4 10*3/uL (ref 0.7–4.0)
MCH: 33.3 pg (ref 26.0–34.0)
MCHC: 31.9 g/dL (ref 30.0–36.0)
MCV: 104.4 fL — ABNORMAL HIGH (ref 80.0–100.0)
Monocytes Absolute: 1 10*3/uL (ref 0.1–1.0)
Monocytes Relative: 12 %
Neutro Abs: 4.5 10*3/uL (ref 1.7–7.7)
Neutrophils Relative %: 55 %
Platelets: UNDETERMINED 10*3/uL (ref 150–400)
RBC: 3.66 MIL/uL — ABNORMAL LOW (ref 4.22–5.81)
RDW: 13.2 % (ref 11.5–15.5)
Smear Review: UNDETERMINED
WBC: 8.1 10*3/uL (ref 4.0–10.5)
nRBC: 0 % (ref 0.0–0.2)

## 2023-05-05 LAB — BASIC METABOLIC PANEL
Anion gap: 14 (ref 5–15)
BUN: 18 mg/dL (ref 8–23)
CO2: 23 mmol/L (ref 22–32)
Calcium: 9.4 mg/dL (ref 8.9–10.3)
Chloride: 103 mmol/L (ref 98–111)
Creatinine, Ser: 0.91 mg/dL (ref 0.61–1.24)
GFR, Estimated: >60 mL/min (ref >60)
Glucose, Bld: 99 mg/dL (ref 70–99)
Potassium: 3.3 mmol/L — ABNORMAL LOW (ref 3.5–5.1)
Sodium: 140 mmol/L (ref 135–145)

## 2023-05-05 LAB — URINALYSIS, ROUTINE W REFLEX MICROSCOPIC
Bilirubin Urine: NEGATIVE
Glucose, UA: NEGATIVE mg/dL
Hgb urine dipstick: NEGATIVE
Ketones, ur: 5 mg/dL — AB
Nitrite: NEGATIVE
Protein, ur: 30 mg/dL — AB
Specific Gravity, Urine: 1.018 (ref 1.005–1.030)
pH: 6 (ref 5.0–8.0)

## 2023-05-05 MED ORDER — CEFTRIAXONE SODIUM 1 G IJ SOLR
1.0000 g | Freq: Once | INTRAMUSCULAR | Status: AC
  Administered 2023-05-05: 1 g via INTRAMUSCULAR
  Filled 2023-05-05 (×2): qty 10

## 2023-05-05 MED ORDER — CEPHALEXIN 500 MG PO CAPS: 500.0000 mg | ORAL_CAPSULE | Freq: Four times a day (QID) | ORAL | 0 refills | Status: AC

## 2023-05-05 MED ORDER — SODIUM CHLORIDE 0.9 % IV SOLN: 1.0000 g | Freq: Once | INTRAVENOUS | Status: DC

## 2023-05-05 MED ORDER — CEFTRIAXONE SODIUM 1 G IJ SOLR
1.0000 g | Freq: Once | INTRAMUSCULAR | Status: DC
Start: 2023-05-05 — End: 2023-05-05

## 2023-05-05 NOTE — ED Notes (Signed)
[facility] called for transport back to home per nurse Efraim Kaufmann

## 2023-05-05 NOTE — TOC Initial Note (Signed)
Transition of Care [facility]) - Initial/Assessment Note    Patient Details  Name: Jose Becker MRN: [id number] Date of Birth: [birth year]  [facility] CM/SW Contact:    Marquita Palms, LCSW Phone Number: 05/05/2023, 3:58 PM  Clinical Narrative:                  CSW received phone call from [place] Dec with [facility]. She reports that the patients nurse was at home with home health and nurse recommended patient to the hospital. Patients wife reported she is unable to care for him in this conditions. Awaiting physical and occupational therapy evaluations for referral.       Patient Goals and CMS Choice            Expected Discharge Plan and Services                                              Prior Living Arrangements/Services                       Activities of Daily Living      Permission Sought/Granted                  Emotional Assessment              Admission diagnosis:  fall ems Patient Active Problem List   Diagnosis Date Noted   Change in bowel habits 06/28/2020   PCP:  Marisue Ivan, MD Pharmacy:   [id number] - [place], [place] - [address] AT [facility] OF [address] [place] [address] Phone: [phone] Fax: [phone]     Social Determinants of Health (SDOH) Social History: SDOH Screenings   Food Insecurity: No Food Insecurity (03/13/2023)   Received from [facility]  Transportation Needs: No Transportation Needs (03/13/2023)   Received from [facility]  Utilities: Not At Risk (03/13/2023)   Received from [facility]  Financial Resource Strain: Low Risk  (03/13/2023)   Received from [facility]  Tobacco Use: High Risk (05/05/2023)   SDOH Interventions:     Readmission Risk Interventions     No data to display

## 2023-05-05 NOTE — ED Triage Notes (Signed)
EMS reports patient fell one week ago hitting head; wife reported to EMS patient is at baseline. Patient denies all complaints at this time. Denies blood thinners.

## 2023-05-05 NOTE — ED Notes (Signed)
Patient awaiting transport via EMS.

## 2023-05-05 NOTE — Discharge Instructions (Addendum)
Please seek medical attention for any high fevers, chest pain, shortness of breath, change in behavior, persistent vomiting, bloody stool or any other new or concerning symptoms.  

## 2023-05-05 NOTE — ED Provider Notes (Signed)
[facility] Provider Note    Event Date/Time   First MD Initiated Contact with Patient 05/05/23 1228     (approximate)   History   Confusion   HPI  Jose Becker is a 82 y.o. male   who presents to the emergency department today at the advice of home health nurse after the visit today.  Apparently the patient had a fall about a week ago.  Did hit his head at that time.  Today he appeared somewhat more confused.  Patient however does have some history of dementia per family.  Additionally there is some concerns for urinary tract infection.  His urine has been darker recently and they have not been able to tested.  Furthermore home health nurse had concerns that his blood pressure was elevated and that he might be in atrial fibrillation.  Patient himself states he has been having some right sided headaches.      Physical Exam   Triage Vital Signs: ED Triage Vitals  Encounter Vitals Group     BP 05/05/23 1232 (!) 188/85     Systolic BP Percentile --      Diastolic BP Percentile --      Pulse Rate 05/05/23 1230 81     Resp 05/05/23 1230 18     Temp 05/05/23 1230 98.2 F (36.8 C)     Temp Source 05/05/23 1230 Oral     SpO2 05/05/23 1230 98 %     Weight 05/05/23 1231 168 lb (76.2 kg)     Height 05/05/23 1231 6\' 1"  (1.854 m)     Head Circumference --      Peak Flow --      Pain Score 05/05/23 1229 0     Pain Loc --      Pain Education --      Exclude from Growth Chart --     Most recent vital signs: Vitals:   05/05/23 1230 05/05/23 1232  BP:  (!) 188/85  Pulse: 81   Resp: 18   Temp: 98.2 F (36.8 C)   SpO2: 98%    General: Awake, alert, not completely oriented. CV:  Good peripheral perfusion. Regular rate and rhythm. Resp:  Normal effort. Lungs clear. Abd:  No distention.    ED Results / Procedures / Treatments   Labs (all labs ordered are listed, but only abnormal results are displayed) Labs Reviewed  URINALYSIS, ROUTINE W  REFLEX MICROSCOPIC - Abnormal; Notable for the following components:      Result Value   Color, Urine AMBER (*)    APPearance CLOUDY (*)    Ketones, ur 5 (*)    Protein, ur 30 (*)    Leukocytes,Ua MODERATE (*)    Bacteria, UA MANY (*)    All other components within normal limits  CBC WITH DIFFERENTIAL/PLATELET - Abnormal; Notable for the following components:   RBC 3.66 (*)    Hemoglobin 12.2 (*)    HCT 38.2 (*)    MCV 104.4 (*)    All other components within normal limits  BASIC METABOLIC PANEL - Abnormal; Notable for the following components:   Potassium 3.3 (*)    All other components within normal limits     EKG  I, Phineas Semen, attending physician, personally viewed and interpreted this EKG  EKG Time: 1230 Rate: 84 Rhythm: sinus rhythm with PACs Axis: left axis deviation Intervals: qtc 451 QRS: LAFB ST changes: no st elevation Impression: abnormal ekg  RADIOLOGY I  independently interpreted and visualized the CT head. My interpretation: No bleed Radiology interpretation:  IMPRESSION:  1. No acute intracranial abnormality.  2. Severe chronic microvascular ischemic change.      PROCEDURES:  Critical Care performed: No   MEDICATIONS ORDERED IN ED: Medications - No data to display   IMPRESSION / MDM / ASSESSMENT AND PLAN / ED COURSE  I reviewed the triage vital signs and the nursing notes.                              Differential diagnosis includes, but is not limited to, intracranial bleed, infection, anemia  Patient's presentation is most consistent with acute presentation with potential threat to life or bodily function.   The patient is on the cardiac monitor to evaluate for evidence of arrhythmia and/or significant heart rate changes.  Patient presents to the emergency department today because of concerns for confusion.  Exam patient is awake and alert not completely oriented.  Patient apparently has some dementia at baseline.  Patient's  blood work without any concerning leukocytosis.  Urine is consistent with infection.  I do think this could explain some of the confusion.  Will give dose of IV antibiotics here.  EKG does not appear to be atrial fibrillation, does appear to be sinus rhythm with frequent PACs.  Per chart review patient saw cardiology earlier this year and they also felt that the EKG at that time was not consistent with atrial fibrillation.  Head CT without any bleed.  Discussed findings concerning for UTI with patient and family.  Plan will be for dose of antibiotics in the emergency department and then discharged home with prescription for further antibiotics.      FINAL CLINICAL IMPRESSION(S) / ED DIAGNOSES   Final diagnoses:  Lower urinary tract infectious disease      Note:  This document was prepared using Dragon voice recognition software and may include unintentional dictation errors.    Phineas Semen, MD 05/05/23 1455

## 2023-05-15 ENCOUNTER — Other Ambulatory Visit: Payer: Self-pay

## 2023-05-15 ENCOUNTER — Emergency Department
Admission: EM | Admit: 2023-05-15 | Discharge: 2023-05-15 | Disposition: A | Discharge status: Other Acute Inpatient Hospital | Payer: No Typology Code available for payment source | Attending: Student in an Organized Health Care Education/Training Program | Admitting: Student in an Organized Health Care Education/Training Program

## 2023-05-15 DIAGNOSIS — R531 Weakness: Secondary | ICD-10-CM | POA: Diagnosis present

## 2023-05-15 LAB — BASIC METABOLIC PANEL
Anion gap: 11 (ref 5–15)
BUN: 14 mg/dL (ref 8–23)
CO2: 26 mmol/L (ref 22–32)
Calcium: 9.3 mg/dL (ref 8.9–10.3)
Chloride: 103 mmol/L (ref 98–111)
Creatinine, Ser: 1.07 mg/dL (ref 0.61–1.24)
GFR, Estimated: >60 mL/min (ref >60)
Glucose, Bld: 95 mg/dL (ref 70–99)
Potassium: 2.8 mmol/L — ABNORMAL LOW (ref 3.5–5.1)
Sodium: 140 mmol/L (ref 135–145)

## 2023-05-15 LAB — CBC
HCT: 34.7 % — ABNORMAL LOW (ref 39.0–52.0)
Hemoglobin: 11.9 g/dL — ABNORMAL LOW (ref 13.0–17.0)
MCH: 33 pg (ref 26.0–34.0)
MCHC: 34.3 g/dL (ref 30.0–36.0)
MCV: 96.1 fL (ref 80.0–100.0)
Platelets: 364 10*3/uL (ref 150–400)
RBC: 3.61 MIL/uL — ABNORMAL LOW (ref 4.22–5.81)
RDW: 13.2 % (ref 11.5–15.5)
WBC: 9.8 10*3/uL (ref 4.0–10.5)
nRBC: 0 % (ref 0.0–0.2)

## 2023-05-15 LAB — URINALYSIS, ROUTINE W REFLEX MICROSCOPIC
Bacteria, UA: NONE SEEN
Bilirubin Urine: NEGATIVE
Glucose, UA: NEGATIVE mg/dL
Hgb urine dipstick: NEGATIVE
Ketones, ur: 5 mg/dL — AB
Leukocytes,Ua: NEGATIVE
Nitrite: NEGATIVE
Protein, ur: 30 mg/dL — AB
Specific Gravity, Urine: 1.026 (ref 1.005–1.030)
pH: 5 (ref 5.0–8.0)

## 2023-05-15 MED ORDER — POTASSIUM CHLORIDE CRYS ER 20 MEQ PO TBCR
40.0000 meq | EXTENDED_RELEASE_TABLET | Freq: Once | ORAL | Status: AC
  Administered 2023-05-15: 40 meq via ORAL
  Filled 2023-05-15: qty 2

## 2023-05-15 NOTE — ED Notes (Signed)
Approx yellow urine output pt remains aox4 pt signs emtala for transfer

## 2023-05-15 NOTE — ED Notes (Signed)
Pt aox4 provided blanket x2 updated on transfer POC to [facility] pt verbalizes understanding and agrees with current POC

## 2023-05-15 NOTE — ED Notes (Signed)
EMTALA reviewed by this RN.  

## 2023-05-15 NOTE — ED Provider Notes (Signed)
[facility] Provider Note    Event Date/Time   First MD Initiated Contact with Patient 05/15/23 1649     (approximate)   History   Weakness   HPI  Jose Becker is a 82 y.o. male presents the ER for evaluation of several weeks to months of increasing weakness now being bedbound for the past 3 weeks.  Recently treated for UTI.  Patient sent to the ER by PCP office recommending rehab and SNF placement.  Patient does get care at the [place].  He denies any new pain.  No interval falls.  No new medications.  No numbness or tingling.     Physical Exam   Triage Vital Signs: ED Triage Vitals  Encounter Vitals Group     BP 05/15/23 1309 (!) 151/80     Systolic BP Percentile --      Diastolic BP Percentile --      Pulse Rate 05/15/23 1309 61     Resp 05/15/23 1309 17     Temp 05/15/23 1309 98.2 F (36.8 C)     Temp Source 05/15/23 1309 Oral     SpO2 05/15/23 1309 96 %     Weight --      Height --      Head Circumference --      Peak Flow --      Pain Score 05/15/23 1304 0     Pain Loc --      Pain Education --      Exclude from Growth Chart --     Most recent vital signs: Vitals:   05/15/23 1915 05/15/23 1915  BP:    Pulse: (!) 55   Resp: 17 16  Temp:    SpO2: 99%      Constitutional: Alert  Eyes: Conjunctivae are normal.  Head: Atraumatic. Nose: No congestion/rhinnorhea. Mouth/Throat: Mucous membranes are moist.   Neck: Painless ROM.  Cardiovascular:   Good peripheral circulation. Respiratory: Normal respiratory effort.  No retractions.  Gastrointestinal: Soft and nontender.  Musculoskeletal:  no deformity Neurologic:  MAE spontaneously. No gross focal neurologic deficits are appreciated.  Skin:  Skin is warm, dry and intact. No rash noted. Psychiatric: Mood and affect are normal. Speech and behavior are normal.    ED Results / Procedures / Treatments   Labs (all labs ordered are listed, but only abnormal results are  displayed) Labs Reviewed  BASIC METABOLIC PANEL - Abnormal; Notable for the following components:      Result Value   Potassium 2.8 (*)    All other components within normal limits  CBC - Abnormal; Notable for the following components:   RBC 3.61 (*)    Hemoglobin 11.9 (*)    HCT 34.7 (*)    All other components within normal limits  URINALYSIS, ROUTINE W REFLEX MICROSCOPIC - Abnormal; Notable for the following components:   Color, Urine AMBER (*)    APPearance HAZY (*)    Ketones, ur 5 (*)    Protein, ur 30 (*)    All other components within normal limits  CBG MONITORING, ED     EKG     RADIOLOGY    PROCEDURES:  Critical Care performed:   Procedures   MEDICATIONS ORDERED IN ED: Medications  potassium chloride SA (KLOR-CON M) CR tablet 40 mEq (40 mEq Oral Given 05/15/23 1712)     IMPRESSION / MDM / ASSESSMENT AND PLAN / ED COURSE  I reviewed the triage vital signs and the  nursing notes.                              Differential diagnosis includes, but is not limited to, deconditioning, arthritis, electrolyte abnormality, anemia, sepsis  Patient presenting to the ER for evaluation of symptoms as described above.  Based on symptoms, risk factors and considered above differential, this presenting complaint could reflect a potentially life-threatening illness therefore the patient will be placed on continuous pulse oximetry and telemetry for monitoring.  Laboratory evaluation will be sent to evaluate for the above complaints.  Noted to be mildly hypokalemic.  Not toxic.  Hemodynamically well-perfused.  No pain to indicate indication for diagnostic imaging.  Will reach out to [facility] for transfer for rehab placement.    Clinical Course as of 05/15/23 1925  [name] May 15, 2023  [name] Has been accepted in transfer to [facility] [place].  He is stable and appropriate for transfer. [PR]    Clinical Course User Index [PR] Willy Eddy, MD     FINAL CLINICAL IMPRESSION(S) / ED  DIAGNOSES   Final diagnoses:  Weakness     Rx / DC Orders   ED Discharge Orders     None        Note:  This document was prepared using Dragon voice recognition software and may include unintentional dictation errors.    Willy Eddy, MD 05/15/23 [phone]

## 2023-05-15 NOTE — ED Triage Notes (Signed)
Pt comes via EMS from home with generalized weakness. Pt states he needs rehab to help with this issue. VSS

## 2024-04-18 ENCOUNTER — Other Ambulatory Visit: Payer: Self-pay

## 2024-04-18 DIAGNOSIS — R0989 Other specified symptoms and signs involving the circulatory and respiratory systems: Secondary | ICD-10-CM

## 2024-04-18 DIAGNOSIS — Z0189 Encounter for other specified special examinations: Secondary | ICD-10-CM

## 2024-04-29 ENCOUNTER — Encounter: Payer: Self-pay | Admitting: Emergency Medicine

## 2024-04-29 ENCOUNTER — Emergency Department
Admission: EM | Admit: 2024-04-29 | Discharge: 2024-04-29 | Disposition: A | Discharge status: Home / Self Care | Attending: Emergency Medicine | Admitting: Emergency Medicine

## 2024-04-29 ENCOUNTER — Other Ambulatory Visit: Payer: Self-pay

## 2024-04-29 ENCOUNTER — Emergency Department

## 2024-04-29 ENCOUNTER — Ambulatory Visit
Admission: RE | Admit: 2024-04-29 | Discharge: 2024-04-29 | Disposition: A | Discharge status: Home / Self Care | Source: Ambulatory Visit

## 2024-04-29 DIAGNOSIS — R0989 Other specified symptoms and signs involving the circulatory and respiratory systems: Secondary | ICD-10-CM | POA: Diagnosis present

## 2024-04-29 DIAGNOSIS — R42 Dizziness and giddiness: Secondary | ICD-10-CM | POA: Insufficient documentation

## 2024-04-29 DIAGNOSIS — I10 Essential (primary) hypertension: Secondary | ICD-10-CM | POA: Insufficient documentation

## 2024-04-29 DIAGNOSIS — Z79899 Other long term (current) drug therapy: Secondary | ICD-10-CM | POA: Insufficient documentation

## 2024-04-29 DIAGNOSIS — Z0189 Encounter for other specified special examinations: Secondary | ICD-10-CM | POA: Insufficient documentation

## 2024-04-29 DIAGNOSIS — Z7982 Long term (current) use of aspirin: Secondary | ICD-10-CM | POA: Insufficient documentation

## 2024-04-29 LAB — URINALYSIS, ROUTINE W REFLEX MICROSCOPIC
Bacteria, UA: NONE SEEN
Bilirubin Urine: NEGATIVE
Glucose, UA: NEGATIVE mg/dL
Ketones, ur: NEGATIVE mg/dL
Leukocytes,Ua: NEGATIVE
Nitrite: NEGATIVE
Protein, ur: NEGATIVE mg/dL
Specific Gravity, Urine: 1.008 (ref 1.005–1.030)
pH: 7 (ref 5.0–8.0)

## 2024-04-29 LAB — COMPREHENSIVE METABOLIC PANEL WITH GFR
ALT: 9 U/L (ref 0–44)
AST: 21 U/L (ref 15–41)
Albumin: 3.6 g/dL (ref 3.5–5.0)
Alkaline Phosphatase: 60 U/L (ref 38–126)
Anion gap: 9 (ref 5–15)
BUN: 19 mg/dL (ref 8–23)
CO2: 24 mmol/L (ref 22–32)
Calcium: 9 mg/dL (ref 8.9–10.3)
Chloride: 105 mmol/L (ref 98–111)
Creatinine, Ser: 1.36 mg/dL — ABNORMAL HIGH (ref 0.61–1.24)
GFR, Estimated: 51 mL/min — ABNORMAL LOW (ref >60)
Glucose, Bld: 134 mg/dL — ABNORMAL HIGH (ref 70–99)
Potassium: 4.1 mmol/L (ref 3.5–5.1)
Sodium: 138 mmol/L (ref 135–145)
Total Bilirubin: 2.6 mg/dL — ABNORMAL HIGH (ref 0.0–1.2)
Total Protein: 6.3 g/dL — ABNORMAL LOW (ref 6.5–8.1)

## 2024-04-29 LAB — TROPONIN I (HIGH SENSITIVITY): Troponin I (High Sensitivity): 9 ng/L (ref <18)

## 2024-04-29 LAB — CBC
HCT: 31.1 % — ABNORMAL LOW (ref 39.0–52.0)
Hemoglobin: 10.4 g/dL — ABNORMAL LOW (ref 13.0–17.0)
MCH: 30.7 pg (ref 26.0–34.0)
MCHC: 33.4 g/dL (ref 30.0–36.0)
MCV: 91.7 fL (ref 80.0–100.0)
Platelets: 222 K/uL (ref 150–400)
RBC: 3.39 MIL/uL — ABNORMAL LOW (ref 4.22–5.81)
RDW: 15.4 % (ref 11.5–15.5)
WBC: 10 K/uL (ref 4.0–10.5)
nRBC: 0 % (ref 0.0–0.2)

## 2024-04-29 MED ORDER — ONDANSETRON 4 MG PO TBDP: 4.0000 mg | ORAL_TABLET | Freq: Four times a day (QID) | ORAL | 0 refills | Status: AC | PRN

## 2024-04-29 MED ORDER — SODIUM CHLORIDE 0.9 % IV BOLUS (SEPSIS)
500.0000 mL | Freq: Once | INTRAVENOUS | Status: AC
  Administered 2024-04-29: 500 mL via INTRAVENOUS

## 2024-04-29 MED ORDER — ONDANSETRON HCL 4 MG/2ML IJ SOLN
4.0000 mg | Freq: Once | INTRAMUSCULAR | Status: AC
  Administered 2024-04-29: 4 mg via INTRAVENOUS
  Filled 2024-04-29: qty 2

## 2024-04-29 MED ORDER — MECLIZINE HCL 25 MG PO TABS
25.0000 mg | ORAL_TABLET | Freq: Once | ORAL | Status: AC
  Administered 2024-04-29: 25 mg via ORAL
  Filled 2024-04-29: qty 1

## 2024-04-29 MED ORDER — MECLIZINE HCL 25 MG PO TABS: 25.0000 mg | ORAL_TABLET | Freq: Three times a day (TID) | ORAL | 0 refills | Status: AC | PRN

## 2024-04-29 NOTE — Discharge Instructions (Addendum)
 Blood work today was reassuring.  Urine showed no sign of infection or dehydration.  MRI of the brain showed no stroke.  Your symptoms are likely coming from your inner ear.  You do not need to be on antibiotics.  You may follow-up with your primary care doctor but I have also provided you with ENT follow-up if symptoms are not improving with time and medications.

## 2024-04-29 NOTE — ED Provider Notes (Signed)
 [facility] Provider Note    Event Date/Time   First MD Initiated Contact with Patient 04/29/24 [phone]     (approximate)   History   Dizziness   HPI  Jose Becker is a 83 y.o. male with history of hypertension, hyperlipidemia, Gilbert's disease who presents to the emergency department vertigo.  States he went to bed last night around 8 PM and was in his normal state of health and states he felt great.  Woke up just prior to arrival after turning over in the bed and felt like the room was spinning.  No ear pain, tinnitus, hearing loss.  No chest pain or shortness of breath.  No headache, head injury.  No numbness, tingling or focal weakness.  Has nausea but no vomiting or diarrhea.  Patient lives at home with family.  He does not ambulate at baseline.  Denies prior history of stroke.   History provided by patient, wife, niece.    Past Medical History:  Diagnosis Date   Anemia    BPH (benign prostatic hyperplasia)    GERD (gastroesophageal reflux disease)    Gilbert's syndrome    High cholesterol    Hypertension    Obstructive sleep apnea    Post traumatic stress disorder    Tinnitus     Past Surgical History:  Procedure Laterality Date   COLONOSCOPY     COLONOSCOPY WITH PROPOFOL  N/A 03/22/2021   Procedure: COLONOSCOPY WITH PROPOFOL ;  Surgeon: Therisa Bi, MD;  Location: [facility] ENDOSCOPY;  Service: Gastroenterology;  Laterality: N/A;   CYST EXCISION     right arm and back    MEDICATIONS:  Prior to Admission medications   Medication Sig Start Date End Date Taking? Authorizing Provider  alfuzosin (UROXATRAL) 10 MG 24 hr tablet Take 10 mg by mouth daily.    [provider]  aspirin EC 81 MG tablet Take 81 mg by mouth daily.    [provider]  esomeprazole (NEXIUM) 40 MG capsule Take 40 mg by mouth 2 (two) times daily. 10/15/15   [provider]  fluticasone (FLONASE) 50 MCG/ACT nasal spray Place 2 sprays into the nose  daily. 09/18/17   [provider]  fluticasone (FLOVENT DISKUS) 50 MCG/BLIST diskus inhaler Inhale 2 puffs into the lungs daily.    [provider]  GNP D 1000 1000 units capsule Take 1,000 Units by mouth daily. 01/23/18   [provider]  hydrochlorothiazide (HYDRODIURIL) 25 MG tablet Take 25 mg by mouth daily. 10/10/19   [provider]  lisinopril (PRINIVIL,ZESTRIL) 40 MG tablet Take 40 mg by mouth daily. 01/09/18   [provider]  NIFEdipine (PROCARDIA XL/NIFEDICAL-XL) 90 MG 24 hr tablet Take 90 mg by mouth daily. 08/21/19   [provider]  simvastatin (ZOCOR) 20 MG tablet Take 20 mg by mouth at bedtime. 07/23/16   [provider]  tamsulosin (FLOMAX) 0.4 MG CAPS capsule Take 0.4 mg by mouth at bedtime.    [provider]  vitamin B-12 (CYANOCOBALAMIN) 1000 MCG tablet Take 1,000 mcg by mouth daily. Patient not taking: Reported on 06/28/2020    [provider]    Physical Exam   Triage Vital Signs: ED Triage Vitals  Encounter Vitals Group     BP 04/29/24 0319 (!) 150/64     Girls Systolic BP Percentile --      Girls Diastolic BP Percentile --      Boys Systolic BP Percentile --  Boys Diastolic BP Percentile --      Pulse Rate 04/29/24 0319 62     Resp 04/29/24 0319 17     Temp 04/29/24 0319 97.9 F (36.6 C)     Temp Source 04/29/24 0319 Oral     SpO2 04/29/24 0319 93 %     Weight --      Height --      Head Circumference --      Peak Flow --      Pain Score 04/29/24 0325 0     Pain Loc --      Pain Education --      Exclude from Growth Chart --     Most recent vital signs: Vitals:   04/29/24 0319 04/29/24 0325  BP: (!) 150/64   Pulse: 62   Resp: 17   Temp: 97.9 F (36.6 C)   SpO2: 93% 93%    CONSTITUTIONAL: Alert, responds appropriately to questions. Well-appearing; well-nourished, elderly, pleasant HEAD: Normocephalic, atraumatic EYES: Conjunctivae clear, pupils appear equal, sclera  nonicteric ENT: normal nose; moist mucous membranes NECK: Supple, normal ROM CARD: RRR; S1 and S2 appreciated RESP: Normal chest excursion without splinting or tachypnea; breath sounds clear and equal bilaterally; no wheezes, no rhonchi, no rales, no hypoxia or respiratory distress, speaking full sentences ABD/GI: Non-distended; soft, non-tender, no rebound, no guarding, no peritoneal signs BACK: The back appears normal EXT: Normal ROM in all joints; no deformity noted, no edema SKIN: Normal color for age and race; warm; no rash on exposed skin NEURO: Moves all extremities equally, normal speech, normal sensation diffusely, cranial nerves II through XII intact PSYCH: The patient's mood and manner are appropriate.   ED Results / Procedures / Treatments   LABS: (all labs ordered are listed, but only abnormal results are displayed) Labs Reviewed  COMPREHENSIVE METABOLIC PANEL WITH GFR - Abnormal; Notable for the following components:      Result Value   Glucose, Bld 134 (*)    Creatinine, Ser 1.36 (*)    Total Protein 6.3 (*)    Total Bilirubin 2.6 (*)    GFR, Estimated 51 (*)    All other components within normal limits  CBC - Abnormal; Notable for the following components:   RBC 3.39 (*)    Hemoglobin 10.4 (*)    HCT 31.1 (*)    All other components within normal limits  URINALYSIS, ROUTINE W REFLEX MICROSCOPIC - Abnormal; Notable for the following components:   Color, Urine STRAW (*)    APPearance CLEAR (*)    Hgb urine dipstick SMALL (*)    All other components within normal limits  TROPONIN I (HIGH SENSITIVITY)     EKG:  EKG Interpretation Date/Time:  Friday April 29 2024 03:27:38 EDT Ventricular Rate:  61 PR Interval:  230 QRS Duration:  126 QT Interval:  436 QTC Calculation: 438 R Axis:   -62  Text Interpretation: Sinus rhythm with sinus arrhythmia with 1st degree A-V block Left axis deviation Non-specific intra-ventricular conduction block Minimal voltage  criteria for LVH, may be normal variant ( Cornell product ) T wave abnormality, consider lateral ischemia Abnormal ECG When compared with ECG of 15-May-2023 13:21, Premature ventricular complexes are no longer Present PR interval has increased Vent. rate has decreased BY  32 BPM QRS duration has increased Confirmed by Neomi Neptune [phone]) on 04/29/2024 4:16:06 AM         RADIOLOGY: My personal review and interpretation of imaging: MRI brain shows no stroke.  I have personally reviewed all radiology reports.   MR BRAIN WO CONTRAST Result Date: 04/29/2024 EXAM: MR Brain without Intravenous Contrast. CLINICAL HISTORY: 83 year old male with vertigo, neurologic deficit. TECHNIQUE: Magnetic resonance images of the brain without intravenous contrast in multiple planes. CONTRAST: Without. COMPARISON: Brain MRI 03/20/2023. FINDINGS: BRAIN: Generally normal cerebral volume for age, but suspicion of disproportionate mesial temporal lobe volume loss on coronal image 22. No convincing restricted diffusion. Extensive chronic widespread, patchy and confluent bilateral cerebral white matter T2 and FLAIR hyperintensity, which has not significantly changed from last year. No intracranial mass or hemorrhage. No midline shift or extra-axial fluid collection. No cerebellar tonsillar ectopia. The central arterial and venous flow voids are patent. No cortical encephalomalacia identified. Mild chronic microhemorrhage in the right periatrial white matter is stable. No other chronic cerebral blood products. Deep granuloma, brainstem and cerebellum appear normal for age. Small cavum septum pellucidum, normal variant. VENTRICLES: No hydrocephalus. ORBITS: The orbits are normal. SINUSES AND MASTOIDS: Trace mastoid air cell fluid appears inconsequential. Negative visible nasopharynx. Other visible internal auditory structures are grossly normal. BONES: No acute fracture or focal osseous lesion. Chronic cervical spine degeneration.  IMPRESSION: 1. No acute intracranial abnormality. 2. Extensive chronic cerebral white matter disease, stable. 3. Possible disproportionate mesial temporal lobe atrophy, consider neurodegenerative disease. Electronically signed by: Helayne Hurst MD 04/29/2024 06:57 AM EDT RP Workstation: [id number]     PROCEDURES:  Critical Care performed: No     .1-3 Lead EKG Interpretation  Performed by: Lorae Roig, Josette SAILOR, DO Authorized by: Careli Luzader, Josette SAILOR, DO     Interpretation: normal     ECG rate:  62   ECG rate assessment: normal     Rhythm: sinus rhythm     Ectopy: none     Conduction: normal       IMPRESSION / MDM / ASSESSMENT AND PLAN / ED COURSE  I reviewed the triage vital signs and the nursing notes.    Patient here with complaints of vertigo.  Symptoms came on suddenly after rolling over in bed.  Last known well 8 PM last night.  The patient is on the cardiac monitor to evaluate for evidence of arrhythmia and/or significant heart rate changes.   DIFFERENTIAL DIAGNOSIS (includes but not limited to):   Benign positional vertigo, Mnire's disease, viral illness, stroke, anemia, electrolyte derangement, UTI, dehydration, doubt intracranial hemorrhage ACS, arrhythmia   Patient's presentation is most consistent with acute presentation with potential threat to life or bodily function.   PLAN: Will obtain labs, urine, MRI brain.  Will give meclizine, IV fluids, Zofran.   MEDICATIONS GIVEN IN ED: Medications  meclizine (ANTIVERT) tablet 25 mg (25 mg Oral Given 04/29/24 0422)  sodium chloride  0.9 % bolus 500 mL (0 mLs Intravenous Stopped 04/29/24 0636)  ondansetron (ZOFRAN) injection 4 mg (4 mg Intravenous Given 04/29/24 0422)  meclizine (ANTIVERT) tablet 25 mg (25 mg Oral Given 04/29/24 9364)     ED COURSE: Patient's labs show minimally elevated creatinine which she has had previously.  Normal electrolytes.  Hemoglobin of 10.4.  Troponin negative.  Urine shows no sign of infection  or dehydration.  MRI brain reviewed and interpreted by myself and the radiologist and shows no acute abnormality.  He reports feeling better, tolerating p.o. here.  Does not ambulate at baseline.  I feel he is safe to be discharged.  Discussed results with patient as well as his niece and wife by phone.  Will arrange transport home given he is nonambulatory.  He has a PCP for follow-up.  Given ENT follow-up information and will discharge with meclizine, Zofran for symptomatic relief.    At this time, I do not feel there is any life-threatening condition present. I reviewed all nursing notes, vitals, pertinent previous records.  All lab and urine results, EKGs, imaging ordered have been independently reviewed and interpreted by myself.  I reviewed all available radiology reports from any imaging ordered this visit.  Based on my assessment, I feel the patient is safe to be discharged home without further emergent workup and can continue workup as an outpatient as needed. Discussed all findings, treatment plan as well as usual and customary return precautions.  They verbalize understanding and are comfortable with this plan.  Outpatient follow-up has been provided as needed.  All questions have been answered.    CONSULTS:  none   OUTSIDE RECORDS REVIEWED: Reviewed recent notes at the [place].       FINAL CLINICAL IMPRESSION(S) / ED DIAGNOSES   Final diagnoses:  Vertigo     Rx / DC Orders   ED Discharge Orders          Ordered    meclizine (ANTIVERT) 25 MG tablet  3 times daily PRN        04/29/24 0703    ondansetron (ZOFRAN-ODT) 4 MG disintegrating tablet  Every 6 hours PRN        04/29/24 0703             Note:  This document was prepared using Dragon voice recognition software and may include unintentional dictation errors.   Dragan Tamburrino, Josette SAILOR, DO 04/29/24 [id number]

## 2024-04-29 NOTE — ED Triage Notes (Signed)
 Pt to ED via EMS from home, pt is bed bound at baseline, pt c/o dizziness since doubling BP meds, unknown when this was initiated. Pt is scheduled for ECHO tomorrow. 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 20g R wrist   Pt reports dizziness started tonight when he woke up. States the whole house was spinning... NADN in triage

## 2024-04-29 NOTE — ED Triage Notes (Signed)
 Pt to ED via EMS from home, pt is bed bound at baseline, pt c/o dizziness since doubling BP meds, unknown when this was initiated. Pt is scheduled for ECHO tomorrow. 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 20g R wrist

## 2024-04-29 NOTE — ED Notes (Signed)
 Pt resting in stretcher, denies any needs at this time, call bell within reach.

## 2024-06-08 ENCOUNTER — Other Ambulatory Visit: Payer: Self-pay

## 2024-06-08 DIAGNOSIS — R0989 Other specified symptoms and signs involving the circulatory and respiratory systems: Secondary | ICD-10-CM

## 2024-07-05 ENCOUNTER — Other Ambulatory Visit: Payer: Self-pay

## 2024-07-05 DIAGNOSIS — N189 Chronic kidney disease, unspecified: Secondary | ICD-10-CM

## 2024-07-18 ENCOUNTER — Ambulatory Visit

## 2024-07-27 ENCOUNTER — Ambulatory Visit
Admission: RE | Admit: 2024-07-27 | Discharge: 2024-07-27 | Disposition: A | Discharge status: Home / Self Care | Source: Ambulatory Visit

## 2024-07-27 DIAGNOSIS — N189 Chronic kidney disease, unspecified: Secondary | ICD-10-CM | POA: Insufficient documentation
